# Patient Record
Sex: Female | Born: 1955 | Race: Black or African American | Hispanic: No | Marital: Married | Smoking: Never smoker
Health system: Southern US, Community
[De-identification: ages and names within clinical notes are randomized; demographics above are authoritative.]

## PROBLEM LIST (undated history)

## (undated) DIAGNOSIS — E119 Type 2 diabetes mellitus without complications: Secondary | ICD-10-CM

## (undated) DIAGNOSIS — I10 Essential (primary) hypertension: Secondary | ICD-10-CM

## (undated) HISTORY — PX: ABDOMINAL HYSTERECTOMY: SHX81

## (undated) HISTORY — PX: APPENDECTOMY: SHX54

## (undated) HISTORY — PX: TUBAL LIGATION: SHX77

---

## 1999-08-10 ENCOUNTER — Other Ambulatory Visit: Admission: RE | Admit: 1999-08-10 | Discharge: 1999-08-10 | Payer: Self-pay | Admitting: Obstetrics and Gynecology

## 1999-10-08 ENCOUNTER — Inpatient Hospital Stay (HOSPITAL_COMMUNITY): Admission: RE | Admit: 1999-10-08 | Discharge: 1999-10-11 | Payer: Self-pay | Admitting: Obstetrics and Gynecology

## 2000-10-29 ENCOUNTER — Other Ambulatory Visit: Admission: RE | Admit: 2000-10-29 | Discharge: 2000-10-29 | Payer: Self-pay | Admitting: Obstetrics and Gynecology

## 2002-01-27 ENCOUNTER — Other Ambulatory Visit: Admission: RE | Admit: 2002-01-27 | Discharge: 2002-01-27 | Payer: Self-pay | Admitting: Obstetrics and Gynecology

## 2003-06-10 ENCOUNTER — Ambulatory Visit (HOSPITAL_COMMUNITY): Admission: RE | Admit: 2003-06-10 | Discharge: 2003-06-10 | Payer: Self-pay | Admitting: *Deleted

## 2003-08-24 ENCOUNTER — Other Ambulatory Visit: Admission: RE | Admit: 2003-08-24 | Discharge: 2003-08-24 | Payer: Self-pay | Admitting: Obstetrics and Gynecology

## 2004-11-26 ENCOUNTER — Inpatient Hospital Stay (HOSPITAL_COMMUNITY): Admission: EM | Admit: 2004-11-26 | Discharge: 2004-11-27 | Payer: Self-pay | Admitting: Emergency Medicine

## 2014-11-14 ENCOUNTER — Encounter (HOSPITAL_COMMUNITY): Payer: Self-pay | Admitting: Emergency Medicine

## 2014-11-14 ENCOUNTER — Emergency Department (HOSPITAL_COMMUNITY)
Admission: EM | Admit: 2014-11-14 | Discharge: 2014-11-14 | Disposition: A | Discharge status: Home / Self Care | Payer: BC Managed Care – PPO | Attending: Emergency Medicine | Admitting: Emergency Medicine

## 2014-11-14 DIAGNOSIS — R110 Nausea: Secondary | ICD-10-CM | POA: Diagnosis not present

## 2014-11-14 DIAGNOSIS — R5383 Other fatigue: Secondary | ICD-10-CM | POA: Diagnosis present

## 2014-11-14 DIAGNOSIS — I10 Essential (primary) hypertension: Secondary | ICD-10-CM | POA: Diagnosis not present

## 2014-11-14 DIAGNOSIS — R531 Weakness: Secondary | ICD-10-CM | POA: Diagnosis not present

## 2014-11-14 HISTORY — DX: Essential (primary) hypertension: I10

## 2014-11-14 LAB — I-STAT CHEM 8, ED
BUN: 16 mg/dL (ref 6–23)
CALCIUM ION: 1.04 mmol/L — AB (ref 1.13–1.30)
CREATININE: 1.1 mg/dL (ref 0.50–1.10)
Chloride: 97 mmol/L (ref 96–112)
GLUCOSE: 116 mg/dL — AB (ref 70–99)
HCT: 50 % — ABNORMAL HIGH (ref 36.0–46.0)
HEMOGLOBIN: 17 g/dL — AB (ref 12.0–15.0)
POTASSIUM: 3.3 mmol/L — AB (ref 3.5–5.1)
Sodium: 137 mmol/L (ref 135–145)
TCO2: 24 mmol/L (ref 0–100)

## 2014-11-14 LAB — I-STAT CG4 LACTIC ACID, ED: Lactic Acid, Venous: 2.19 mmol/L (ref 0.5–2.0)

## 2014-11-14 MED ORDER — POTASSIUM CHLORIDE CRYS ER 20 MEQ PO TBCR
40.0000 meq | EXTENDED_RELEASE_TABLET | Freq: Once | ORAL | Status: AC
  Administered 2014-11-14: 40 meq via ORAL
  Filled 2014-11-14: qty 2

## 2014-11-14 NOTE — ED Provider Notes (Addendum)
CSN: [id number]     Arrival date & time 11/14/14  1002 History   First MD Initiated Contact with Patient 11/14/14 1029     Chief Complaint  Patient presents with  . Fatigue     (Consider location/radiation/quality/duration/timing/severity/associated sxs/prior Treatment) HPI Patient developed generalized weakness and nausea onset 8:10 AM today. Symptoms lasted 5 minutes, resolve spontaneously. No chest pain no shortness of breath no abdominal pain no headache. She is presently asymptomatic without treatment. She was sent here by coworkers. No other associated symptoms. Patient reports last week she felt that she had "the flu" with headache and general malaise lasting probably 36 hours. Those symptoms resolved spontaneously without treatment. Past Medical History  Diagnosis Date  . Hypertension    History reviewed. No pertinent past surgical history. No family history on file. History  Substance Use Topics  . Smoking status: Never Smoker   . Smokeless tobacco: Not on file  . Alcohol Use: No   OB History    No data available     Review of Systems  HENT: Negative.   Respiratory: Negative.   Cardiovascular: Negative.   Gastrointestinal: Positive for nausea.  Musculoskeletal: Negative.   Skin: Negative.   Neurological: Positive for weakness.  Psychiatric/Behavioral: Negative.   All other systems reviewed and are negative.     Allergies  Review of patient's allergies indicates not on file.  Home Medications   Prior to Admission medications   Not on File   BP 146/72 mmHg  Pulse 67  Temp(Src) 97.8 F (36.6 C) (Oral)  Resp 18  SpO2 100% Physical Exam  Constitutional: She is oriented to person, place, and time. She appears well-developed and well-nourished.  HENT:  Head: Normocephalic and atraumatic.  Eyes: Conjunctivae are normal. Pupils are equal, round, and reactive to light.  Neck: Neck supple. No tracheal deviation present. No thyromegaly present.   Cardiovascular: Normal rate and regular rhythm.   No murmur heard. Pulmonary/Chest: Effort normal and breath sounds normal.  Abdominal: Soft. Bowel sounds are normal. She exhibits no distension. There is no tenderness.  Musculoskeletal: Normal range of motion. She exhibits no edema or tenderness.  Neurological: She is alert and oriented to person, place, and time. No cranial nerve deficit. Coordination normal.  Gait normal not lightheaded on standing  Skin: Skin is warm and dry. No rash noted.  Psychiatric: She has a normal mood and affect.  Nursing note and vitals reviewed.   ED Course  Procedures (including critical care time) Labs Review Labs Reviewed - No data to display  Imaging Review No results found.   EKG Interpretation   Date/Time:  Monday November 14 2014 10:52:36 EST Ventricular Rate:  65 PR Interval:  142 QRS Duration: 109 QT Interval:  423 QTC Calculation: 440 R Axis:   -9 Text Interpretation:  Sinus rhythm RSR' in V1 or V2, probably normal  variant No significant change since last tracing Confirmed by Jaxten Brosh   MD, Cabria Micalizzi [phone]) on 11/14/2014 11:04:23 AM     12:15 PM patient resting comfortably. Asymptomatic Results for orders placed or performed during the hospital encounter of 11/14/14  I-stat chem 8, ed  Result Value Ref Range   Sodium 137 135 - 145 mmol/L   Potassium 3.3 (L) 3.5 - 5.1 mmol/L   Chloride 97 96 - 112 mmol/L   BUN 16 6 - 23 mg/dL   Creatinine, Ser 1.91 0.50 - 1.10 mg/dL   Glucose, Bld 478 (H) 70 - 99 mg/dL   Calcium, Ion 2.95 (  L) 1.13 - 1.30 mmol/L   TCO2 24 0 - 100 mmol/L   Hemoglobin 17.0 (H) 12.0 - 15.0 g/dL   HCT 16.150.0 (H) 09.636.0 - 04.546.0 %  I-Stat CG4 Lactic Acid, ED  Result Value Ref Range   Lactic Acid, Venous 2.19 (HH) 0.5 - 2.0 mmol/L   No results found.  MDM   Not lactate was not ordered by me and was drawn in error and does not correlate clinically to acute illness Doubt angina equivalen Patient's symptoms very brief self  limiting. No chest pain. No EKG abnormality.  Plan potassium chloride 40 meq prior to discharge. Follow-up Dr. Zachery DauerBarnes next week Final diagnoses:  None   diagnosis #1 transient weakness #2 hypokalemia      Doug SouSam Pete Schnitzer, MD 11/14/14 1230  Doug SouSam Kindell Strada, MD 11/14/14 1230

## 2014-11-14 NOTE — Discharge Instructions (Signed)
Weakness Blood potassium was slightly low today at 3.3. You're given additional potassium here .Call Dr. Zachery DauerBarnes to schedule an office visit for within a week. Tell office staff that you were seen here in the your blood potassium level should be rechecked. Return if you feel worse for any reason Weakness is a lack of strength. You may feel weak all over your body or just in one part of your body. Weakness can be serious. In some cases, you may need more medical tests. HOME CARE  Rest.  Eat a well-balanced diet.  Try to exercise every day.  Only take medicines as told by your doctor. GET HELP RIGHT AWAY IF:   You cannot do your normal daily activities.  You cannot walk up and down stairs, or you feel very tired when you do so.  You have shortness of breath or chest pain.  You have trouble moving parts of your body.  You have weakness in only one body part or on only one side of the body.  You have a fever.  You have trouble speaking or swallowing.  You cannot control when you pee (urinate) or poop (bowel movement).  You have black or bloody throw up (vomit) or poop.  Your weakness gets worse or spreads to other body parts.  You have new aches or pains. MAKE SURE YOU:   Understand these instructions.  Will watch your condition.  Will get help right away if you are not doing well or get worse. Document Released: 08/08/2008 Document Revised: 02/25/2012 Document Reviewed: 10/25/2011 [facility] Patient Information 2015 [place], [place]. This information is not intended to replace advice given to you by your health care provider. Make sure you discuss any questions you have with your health care provider.

## 2014-11-14 NOTE — ED Notes (Signed)
Per pt, states she is getting over flu-started last week, went to work today and felt weak and fatigued

## 2014-11-14 NOTE — ED Notes (Signed)
Dr Geoffery SpruceJabcubowitz made aware of abnormal lactic.

## 2019-06-21 ENCOUNTER — Other Ambulatory Visit: Payer: Self-pay

## 2019-06-21 DIAGNOSIS — Z20822 Contact with and (suspected) exposure to covid-19: Secondary | ICD-10-CM

## 2019-06-22 LAB — NOVEL CORONAVIRUS, NAA: SARS-CoV-2, NAA: NOT DETECTED

## 2019-06-23 ENCOUNTER — Telehealth: Payer: Self-pay | Admitting: General Practice

## 2019-06-23 NOTE — Telephone Encounter (Signed)
Negative COVID results given. Patient results "NOT Detected." Caller expressed understanding. ° °

## 2020-09-19 DIAGNOSIS — R7303 Prediabetes: Secondary | ICD-10-CM | POA: Diagnosis not present

## 2020-09-19 DIAGNOSIS — H5789 Other specified disorders of eye and adnexa: Secondary | ICD-10-CM | POA: Diagnosis not present

## 2020-09-19 DIAGNOSIS — I10 Essential (primary) hypertension: Secondary | ICD-10-CM | POA: Diagnosis not present

## 2020-09-22 DIAGNOSIS — R7303 Prediabetes: Secondary | ICD-10-CM | POA: Diagnosis not present

## 2020-09-22 DIAGNOSIS — I10 Essential (primary) hypertension: Secondary | ICD-10-CM | POA: Diagnosis not present

## 2020-09-22 DIAGNOSIS — H5789 Other specified disorders of eye and adnexa: Secondary | ICD-10-CM | POA: Diagnosis not present

## 2020-11-21 DIAGNOSIS — Z6841 Body Mass Index (BMI) 40.0 and over, adult: Secondary | ICD-10-CM | POA: Diagnosis not present

## 2020-11-21 DIAGNOSIS — Z01419 Encounter for gynecological examination (general) (routine) without abnormal findings: Secondary | ICD-10-CM | POA: Diagnosis not present

## 2020-11-21 DIAGNOSIS — Z1231 Encounter for screening mammogram for malignant neoplasm of breast: Secondary | ICD-10-CM | POA: Diagnosis not present

## 2020-11-28 DIAGNOSIS — N958 Other specified menopausal and perimenopausal disorders: Secondary | ICD-10-CM | POA: Diagnosis not present

## 2020-12-12 DIAGNOSIS — M79674 Pain in right toe(s): Secondary | ICD-10-CM | POA: Diagnosis not present

## 2021-01-15 DIAGNOSIS — I10 Essential (primary) hypertension: Secondary | ICD-10-CM | POA: Diagnosis not present

## 2021-01-15 DIAGNOSIS — R7303 Prediabetes: Secondary | ICD-10-CM | POA: Diagnosis not present

## 2021-01-15 DIAGNOSIS — M79671 Pain in right foot: Secondary | ICD-10-CM | POA: Diagnosis not present

## 2021-01-15 DIAGNOSIS — N1831 Chronic kidney disease, stage 3a: Secondary | ICD-10-CM | POA: Diagnosis not present

## 2021-02-14 DIAGNOSIS — Z800 Family history of malignant neoplasm of digestive organs: Secondary | ICD-10-CM | POA: Diagnosis not present

## 2021-02-14 DIAGNOSIS — Z1211 Encounter for screening for malignant neoplasm of colon: Secondary | ICD-10-CM | POA: Diagnosis not present

## 2021-02-14 DIAGNOSIS — Z6841 Body Mass Index (BMI) 40.0 and over, adult: Secondary | ICD-10-CM | POA: Diagnosis not present

## 2021-02-15 ENCOUNTER — Other Ambulatory Visit: Payer: Self-pay | Admitting: Gastroenterology

## 2021-02-15 DIAGNOSIS — Z800 Family history of malignant neoplasm of digestive organs: Secondary | ICD-10-CM

## 2021-02-19 DIAGNOSIS — I10 Essential (primary) hypertension: Secondary | ICD-10-CM | POA: Diagnosis not present

## 2021-03-07 ENCOUNTER — Other Ambulatory Visit: Payer: Self-pay

## 2021-03-07 ENCOUNTER — Ambulatory Visit
Admission: RE | Admit: 2021-03-07 | Discharge: 2021-03-07 | Disposition: A | Discharge status: Home / Self Care | Payer: Medicare PPO | Source: Ambulatory Visit | Attending: Gastroenterology | Admitting: Gastroenterology

## 2021-03-07 DIAGNOSIS — Z800 Family history of malignant neoplasm of digestive organs: Secondary | ICD-10-CM

## 2021-03-07 DIAGNOSIS — Z8507 Personal history of malignant neoplasm of pancreas: Secondary | ICD-10-CM | POA: Diagnosis not present

## 2021-03-07 DIAGNOSIS — K449 Diaphragmatic hernia without obstruction or gangrene: Secondary | ICD-10-CM | POA: Diagnosis not present

## 2021-03-07 MED ORDER — GADOBENATE DIMEGLUMINE 529 MG/ML IV SOLN
20.0000 mL | Freq: Once | INTRAVENOUS | Status: AC | PRN
  Administered 2021-03-07: 20 mL via INTRAVENOUS

## 2021-06-11 DIAGNOSIS — Z23 Encounter for immunization: Secondary | ICD-10-CM | POA: Diagnosis not present

## 2021-06-11 DIAGNOSIS — H109 Unspecified conjunctivitis: Secondary | ICD-10-CM | POA: Diagnosis not present

## 2021-06-19 DIAGNOSIS — N1831 Chronic kidney disease, stage 3a: Secondary | ICD-10-CM | POA: Diagnosis not present

## 2021-06-19 DIAGNOSIS — H53143 Visual discomfort, bilateral: Secondary | ICD-10-CM | POA: Diagnosis not present

## 2021-06-19 DIAGNOSIS — H5713 Ocular pain, bilateral: Secondary | ICD-10-CM | POA: Diagnosis not present

## 2021-06-19 DIAGNOSIS — R519 Headache, unspecified: Secondary | ICD-10-CM | POA: Diagnosis not present

## 2021-06-19 DIAGNOSIS — I10 Essential (primary) hypertension: Secondary | ICD-10-CM | POA: Diagnosis not present

## 2021-06-20 DIAGNOSIS — H20043 Secondary noninfectious iridocyclitis, bilateral: Secondary | ICD-10-CM | POA: Diagnosis not present

## 2021-07-04 DIAGNOSIS — H40053 Ocular hypertension, bilateral: Secondary | ICD-10-CM | POA: Diagnosis not present

## 2021-07-04 DIAGNOSIS — H20043 Secondary noninfectious iridocyclitis, bilateral: Secondary | ICD-10-CM | POA: Diagnosis not present

## 2021-07-16 DIAGNOSIS — H40053 Ocular hypertension, bilateral: Secondary | ICD-10-CM | POA: Diagnosis not present

## 2021-07-16 DIAGNOSIS — H20043 Secondary noninfectious iridocyclitis, bilateral: Secondary | ICD-10-CM | POA: Diagnosis not present

## 2021-07-27 DIAGNOSIS — H40053 Ocular hypertension, bilateral: Secondary | ICD-10-CM | POA: Diagnosis not present

## 2021-07-27 DIAGNOSIS — H2513 Age-related nuclear cataract, bilateral: Secondary | ICD-10-CM | POA: Diagnosis not present

## 2021-07-27 DIAGNOSIS — H209 Unspecified iridocyclitis: Secondary | ICD-10-CM | POA: Diagnosis not present

## 2021-09-10 DIAGNOSIS — H20043 Secondary noninfectious iridocyclitis, bilateral: Secondary | ICD-10-CM | POA: Diagnosis not present

## 2021-09-10 DIAGNOSIS — H40053 Ocular hypertension, bilateral: Secondary | ICD-10-CM | POA: Diagnosis not present

## 2021-09-14 DIAGNOSIS — E7800 Pure hypercholesterolemia, unspecified: Secondary | ICD-10-CM | POA: Diagnosis not present

## 2021-09-14 DIAGNOSIS — N1831 Chronic kidney disease, stage 3a: Secondary | ICD-10-CM | POA: Diagnosis not present

## 2021-09-14 DIAGNOSIS — I10 Essential (primary) hypertension: Secondary | ICD-10-CM | POA: Diagnosis not present

## 2021-09-14 DIAGNOSIS — E1169 Type 2 diabetes mellitus with other specified complication: Secondary | ICD-10-CM | POA: Diagnosis not present

## 2021-09-14 DIAGNOSIS — R7303 Prediabetes: Secondary | ICD-10-CM | POA: Diagnosis not present

## 2021-09-14 DIAGNOSIS — Z0000 Encounter for general adult medical examination without abnormal findings: Secondary | ICD-10-CM | POA: Diagnosis not present

## 2021-10-08 DIAGNOSIS — R7303 Prediabetes: Secondary | ICD-10-CM | POA: Insufficient documentation

## 2021-10-08 DIAGNOSIS — Z6841 Body Mass Index (BMI) 40.0 and over, adult: Secondary | ICD-10-CM | POA: Insufficient documentation

## 2021-10-08 DIAGNOSIS — E876 Hypokalemia: Secondary | ICD-10-CM | POA: Insufficient documentation

## 2021-10-08 DIAGNOSIS — Z03818 Encounter for observation for suspected exposure to other biological agents ruled out: Secondary | ICD-10-CM | POA: Diagnosis not present

## 2021-10-08 DIAGNOSIS — E7800 Pure hypercholesterolemia, unspecified: Secondary | ICD-10-CM | POA: Insufficient documentation

## 2021-10-08 DIAGNOSIS — E1169 Type 2 diabetes mellitus with other specified complication: Secondary | ICD-10-CM | POA: Insufficient documentation

## 2021-10-08 DIAGNOSIS — I10 Essential (primary) hypertension: Secondary | ICD-10-CM | POA: Diagnosis not present

## 2021-10-08 DIAGNOSIS — N1831 Chronic kidney disease, stage 3a: Secondary | ICD-10-CM | POA: Insufficient documentation

## 2021-10-08 DIAGNOSIS — Z20822 Contact with and (suspected) exposure to covid-19: Secondary | ICD-10-CM | POA: Diagnosis not present

## 2021-11-29 DIAGNOSIS — Z1231 Encounter for screening mammogram for malignant neoplasm of breast: Secondary | ICD-10-CM | POA: Diagnosis not present

## 2021-11-29 DIAGNOSIS — Z01419 Encounter for gynecological examination (general) (routine) without abnormal findings: Secondary | ICD-10-CM | POA: Diagnosis not present

## 2021-11-29 DIAGNOSIS — Z6841 Body Mass Index (BMI) 40.0 and over, adult: Secondary | ICD-10-CM | POA: Diagnosis not present

## 2022-02-27 IMAGING — MR MR ABDOMEN WO/W CM
13 of 20 series · 29 of 48 positions shown · IV contrast (multihance)
Comparison: None.

CLINICAL DATA: Family history of pancreatic cancer

EXAM:
MRI ABDOMEN WITHOUT AND WITH CONTRAST
TECHNIQUE: Multiplanar multisequence MR imaging of the abdomen was performed
both before and after the administration of intravenous contrast.
CONTRAST:  20mL MULTIHANCE GADOBENATE DIMEGLUMINE 529 MG/ML IV SOLN

[Series 3: T2 · coronal · 5.0mm · 1.56mm/px · 2 of 40 slices shown (1 of 3)]
[im 1/40]
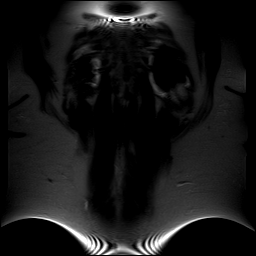
[im 40/40]
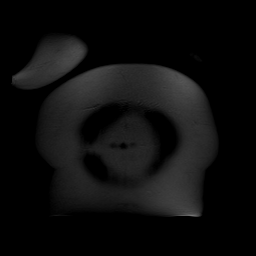

[Series 4: T2 · axial · 5.0mm · 1.41mm/px · z∈[-109,+135]mm · 2 of 40 slices shown (2 of 3)]
[im 1/40]
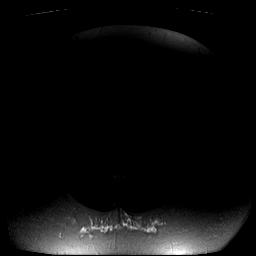
[im 40/40]
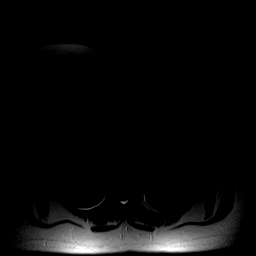

[Series 5: axial in out · axial · 6.0mm · 0.74mm/px · z∈[-110,+136]mm · 4 of 72 slices shown]
[im 1/72]
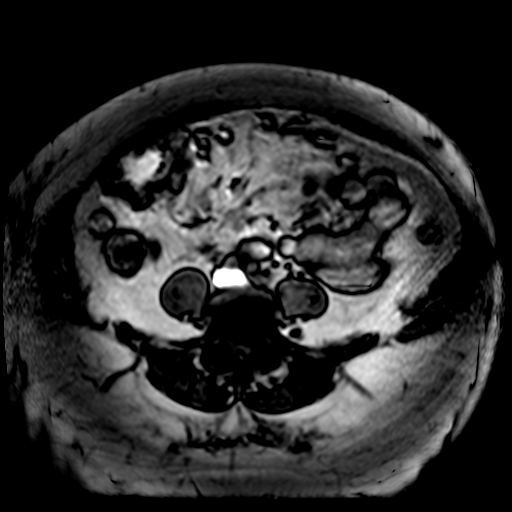
[im 24/72]
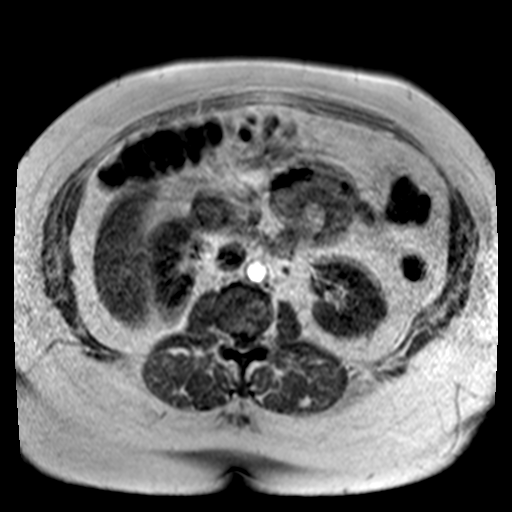
[im 48/72]
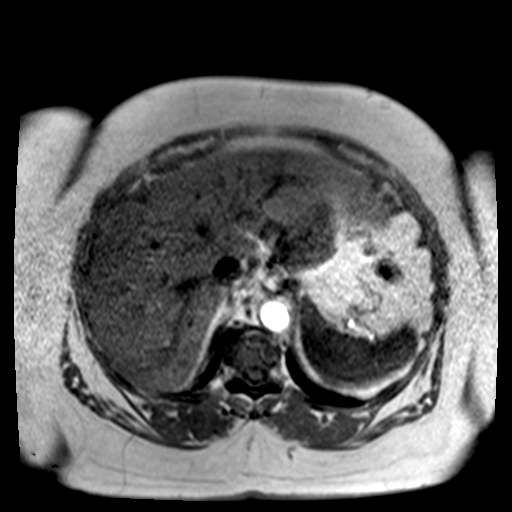
[im 72/72]
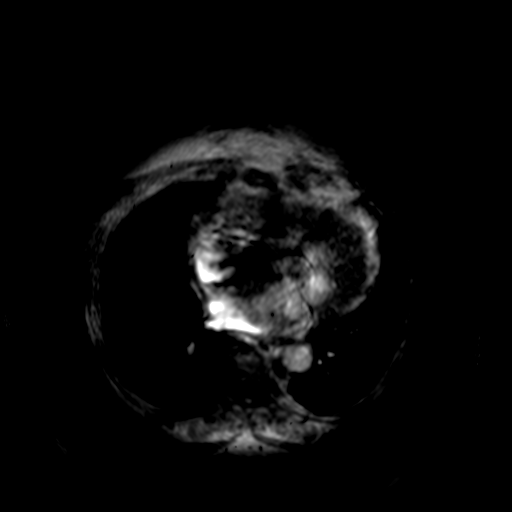

[Series 6: T2 · axial · 6.0mm · 0.74mm/px · 1 of 40 slices shown (3 of 3)]
[im 1/40]
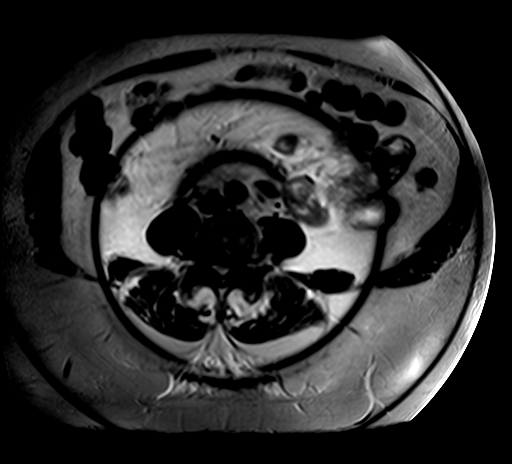

[Series 9: MRCP · sagittal · 0.94mm/px · 1 of 9 slices shown]
[im 1/9]
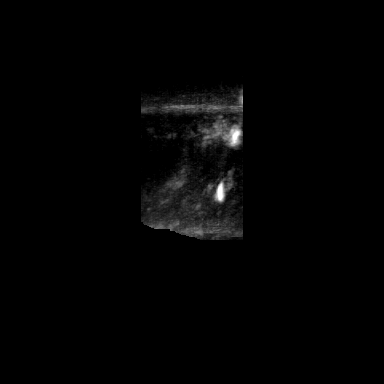

[Series 11: ep2d_diff_b50_500_800_p2_trig · axial · 6.0mm · 1.98mm/px · z∈[-95,+186]mm · 4 of 120 slices shown]
[im 1/120]
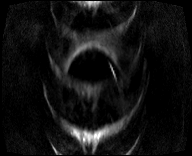
[im 40/120]
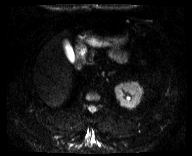
[im 80/120]
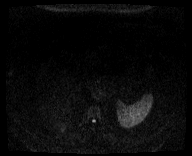
[im 120/120]
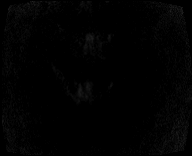

[Series 12: ep2d_diff_b50_500_800_p2_trig_adc · axial · 6.0mm · 1.98mm/px · 1 of 40 slices shown]
[im 1/40]
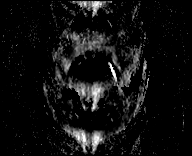

[Series 13: axial tru fisp · axial · 5.0mm · 1.41mm/px · 1 of 37 slices shown (1 of 2)]
[im 1/37]
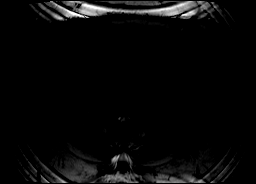

[Series 14: T1 dynamic · axial · non-contrast · 3.0mm · 0.78mm/px · z∈[-124,+137]mm · 3 of 88 slices shown]
[im 1/88]
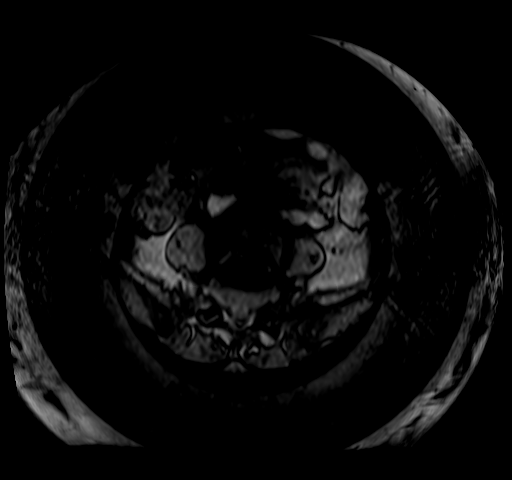
[im 44/88]
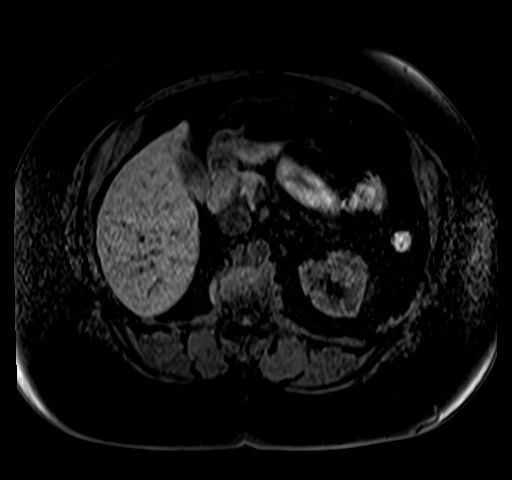
[im 88/88]
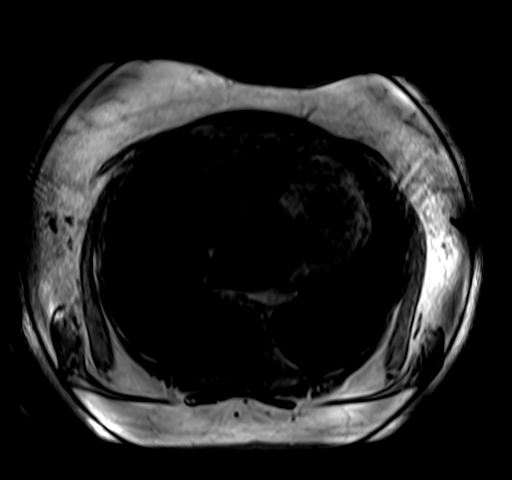

[Series 15: axial tru fisp · axial · 5.0mm · 1.41mm/px · 1 of 37 slices shown (2 of 2)]
[im 1/37]
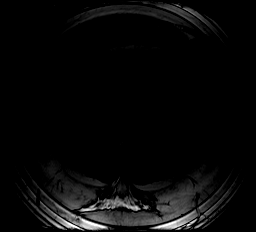

[Series 16: post 25 sec · axial · 3.0mm · 0.78mm/px · z∈[-124,+137]mm · 3 of 88 slices shown]
[im 1/88]
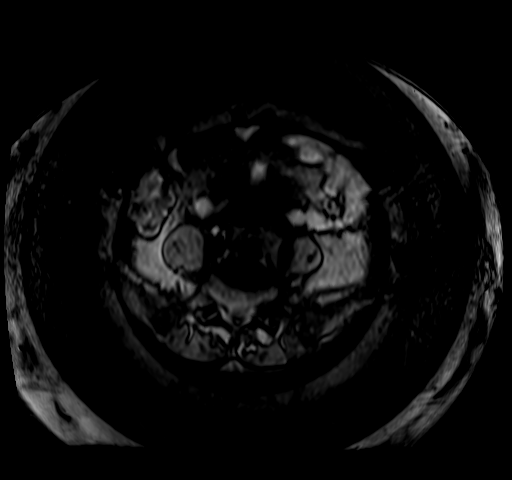
[im 44/88]
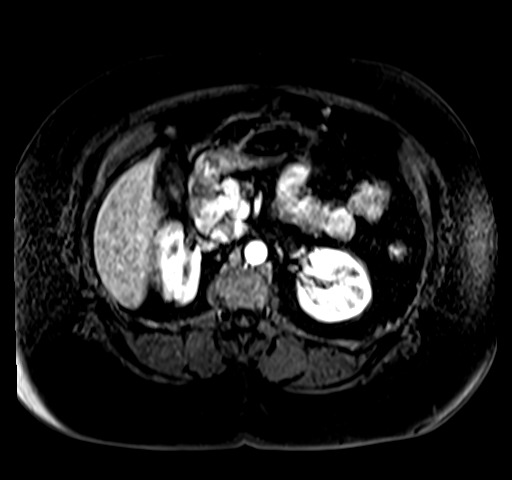
[im 88/88]
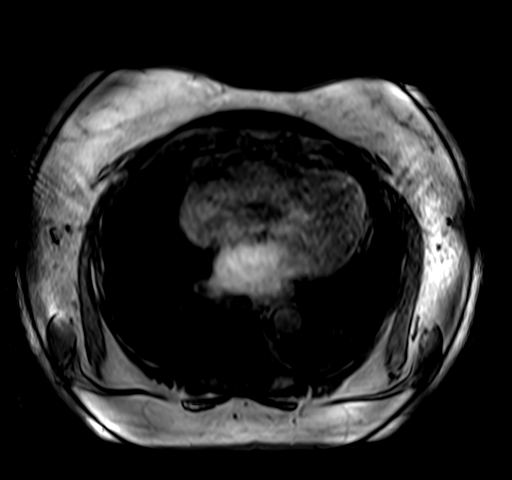

[Series 17: post 25 sec_sub · axial · 3.0mm · 0.78mm/px · z∈[-124,+137]mm · 3 of 88 slices shown]
[im 1/88]
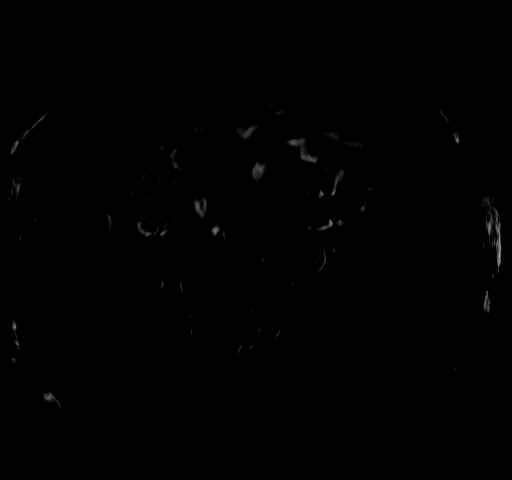
[im 44/88]
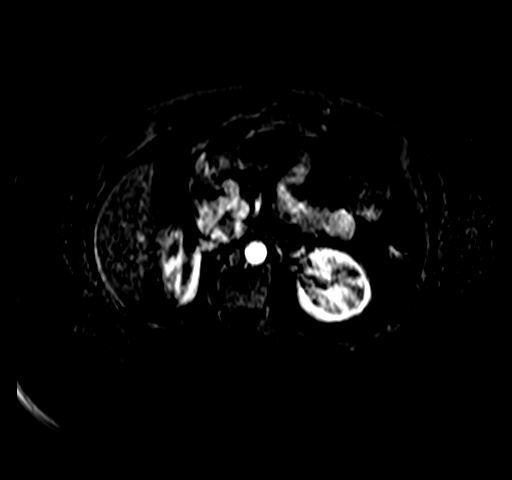
[im 88/88]
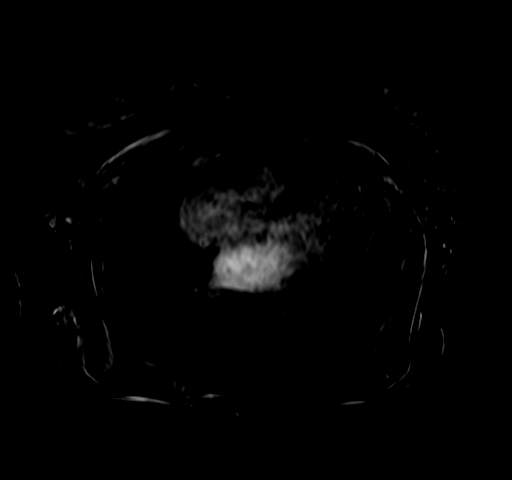

[Series 18: post 45 sec · axial · 3.0mm · 0.78mm/px · z∈[-124,+137]mm · 3 of 88 slices shown]
[im 1/88]
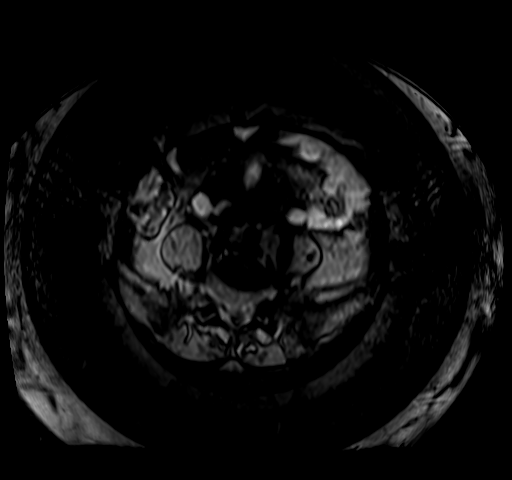
[im 44/88]
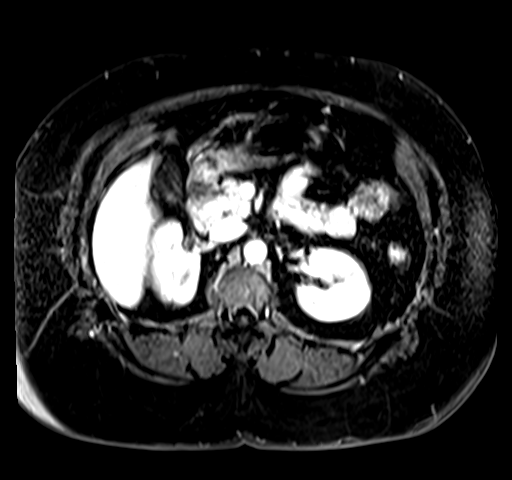
[im 88/88]
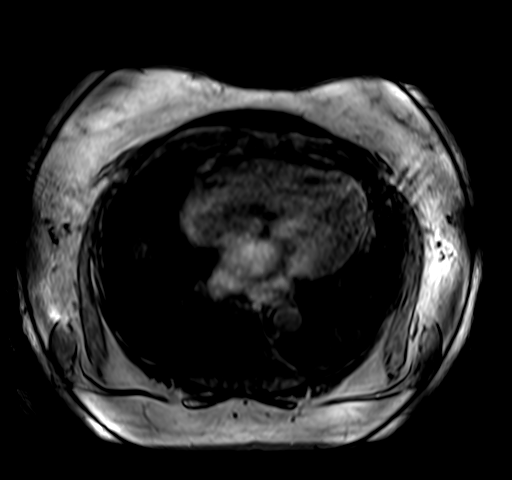

[29 of 48 positions shown; findings below may reference images not displayed]

FINDINGS: Lower chest: No acute findings. Moderate hiatal hernia with
intrathoracic position of the gastric fundus.

Hepatobiliary: No mass or other parenchymal abnormality identified.
No biliary ductal dilatation.

Pancreas: No mass, inflammatory changes, or other parenchymal
abnormality identified. No pancreatic ductal dilatation.

Spleen:  Within normal limits in size and appearance.

Adrenals/Urinary Tract: No masses identified. No evidence of
hydronephrosis.

Stomach/Bowel: Visualized portions within the abdomen are
unremarkable.

Vascular/Lymphatic: No pathologically enlarged lymph nodes
identified. No abdominal aortic aneurysm demonstrated.

Other:  None.

Musculoskeletal: No suspicious bone lesions identified.
IMPRESSION: 1. No evidence of pancreatic mass or other evidence of malignancy in
the abdomen.
2. Moderate hiatal hernia.

## 2022-03-18 DIAGNOSIS — H40053 Ocular hypertension, bilateral: Secondary | ICD-10-CM | POA: Diagnosis not present

## 2022-03-18 DIAGNOSIS — H20043 Secondary noninfectious iridocyclitis, bilateral: Secondary | ICD-10-CM | POA: Diagnosis not present

## 2022-03-18 DIAGNOSIS — H25813 Combined forms of age-related cataract, bilateral: Secondary | ICD-10-CM | POA: Diagnosis not present

## 2022-03-18 DIAGNOSIS — H35033 Hypertensive retinopathy, bilateral: Secondary | ICD-10-CM | POA: Diagnosis not present

## 2022-03-28 DIAGNOSIS — E7800 Pure hypercholesterolemia, unspecified: Secondary | ICD-10-CM | POA: Diagnosis not present

## 2022-03-28 DIAGNOSIS — E1169 Type 2 diabetes mellitus with other specified complication: Secondary | ICD-10-CM | POA: Diagnosis not present

## 2022-03-28 DIAGNOSIS — H35033 Hypertensive retinopathy, bilateral: Secondary | ICD-10-CM | POA: Diagnosis not present

## 2022-03-28 DIAGNOSIS — I10 Essential (primary) hypertension: Secondary | ICD-10-CM | POA: Diagnosis not present

## 2022-03-28 DIAGNOSIS — N1831 Chronic kidney disease, stage 3a: Secondary | ICD-10-CM | POA: Diagnosis not present

## 2022-03-28 DIAGNOSIS — Z6841 Body Mass Index (BMI) 40.0 and over, adult: Secondary | ICD-10-CM | POA: Diagnosis not present

## 2022-04-25 DIAGNOSIS — M272 Inflammatory conditions of jaws: Secondary | ICD-10-CM | POA: Diagnosis not present

## 2022-06-24 DIAGNOSIS — N1831 Chronic kidney disease, stage 3a: Secondary | ICD-10-CM | POA: Diagnosis not present

## 2022-06-24 DIAGNOSIS — M545 Low back pain, unspecified: Secondary | ICD-10-CM | POA: Diagnosis not present

## 2022-06-24 DIAGNOSIS — E1169 Type 2 diabetes mellitus with other specified complication: Secondary | ICD-10-CM | POA: Diagnosis not present

## 2022-07-15 DIAGNOSIS — M25551 Pain in right hip: Secondary | ICD-10-CM | POA: Diagnosis not present

## 2022-07-15 DIAGNOSIS — M545 Low back pain, unspecified: Secondary | ICD-10-CM | POA: Diagnosis not present

## 2022-07-15 DIAGNOSIS — Z6841 Body Mass Index (BMI) 40.0 and over, adult: Secondary | ICD-10-CM | POA: Diagnosis not present

## 2022-07-25 NOTE — Therapy (Signed)
OUTPATIENT PHYSICAL THERAPY THORACOLUMBAR EVALUATION   Patient Name: Kristen Alvarado MRN: [id number] DOB:Aug 11, 1956, 66 y.o., female Today's Date: 07/30/2022  END OF SESSION:  PT End of Session - 07/30/22 0949     Visit Number 1    Number of Visits 13    Date for PT Re-Evaluation 09/10/22    Authorization Type E-HUMANA MEDICARE/HUMANA MEDICARE CHOICE PPO Foto 6th and 10th visit progress note 10th    Progress Note Due on Visit 10    PT Start Time 0848    PT Stop Time 0930    PT Time Calculation (min) 42 min    Activity Tolerance Patient limited by pain;Patient tolerated treatment well    Behavior During Therapy [facility] for tasks assessed/performed             Past Medical History:  Diagnosis Date   Hypertension    No past surgical history on file. There are no problems to display for this patient.   PCP: Soundra Pilon, FNP   REFERRING PROVIDER: Daisy Floro, MD   REFERRING DIAG: M54.50 (ICD-10-CM) - Low back pain, unspecified   Rationale for Evaluation and Treatment: Rehabilitation  THERAPY DIAG:  Chronic bilateral low back pain with bilateral sciatica - Plan: PT plan of care cert/re-cert  Muscle weakness (generalized) - Plan: PT plan of care cert/re-cert  Difficulty in walking, not elsewhere classified - Plan: PT plan of care cert/re-cert  ONSET DATE: October 2023  SUBJECTIVE:                                                                                                                                                                                           SUBJECTIVE STATEMENT: I tripped  but I strained my back and it radiates down my legs when I stand too long. I have not fallen at all  in last 6 months but I did trip over a box in the floor. I go to [employer] but when I tripped in mid October I have not been back because I hurt so much  I am a 10/10.  I used to walk and do some weights.  I can only walk 2 min.  I can't stand longer than 1 min.   I can sit and watch TV 30 minutes. My pain is straight across my low back and into my buttocks and sometimes down to both calf muscles.  PERTINENT HISTORY:  HTN, DM, hypercholesteremia,obesity  PAIN:  Are you having pain? Yes: NPRS scale: 10/10 even sitting and resting and my pain is over 10/10 Pain location: BIl low back and radiates into my buttocks and thigh  but not past my knees sometimes when I stand too long I have pain down both my calf muscles Pain description: muscle cramps, throbbing and sharp Aggravating factors: walking and standing, I can't wash dishes, cook, or laundry getting up from chair is better than sitting down. I just fall into my chair Relieving factors: nothing,sometimes Tylenol but I still have pain  PRECAUTIONS: None  WEIGHT BEARING RESTRICTIONS: No  FALLS:  Has patient fallen in last 6 months? 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  When I get up and down from chair without falling into my chair. And going back to the [employer] to work out  NEXT MD VISIT:   OBJECTIVE:   DIAGNOSTIC FINDINGS:  None recent  PATIENT SURVEYS:  FOTO 40% predicted 59%  SCREENING FOR RED FLAGS: Bowel or bladder incontinence: No   COGNITION: Overall cognitive status: Within functional limits for tasks assessed     SENSATION: Tingling numbness into bil buttocks and pain into calf mx bil  MUSCLE LENGTH: Hamstrings: Right 60 deg; Left 60 deg   POSTURE: rounded shoulders, forward head, anterior pelvic tilt, and obesity Pt with flexed posture standing and sitting  PALPATION: Bil QL tenderness and bil buttocks over iliac crests  LUMBAR ROM:   AROM eval  Flexion Able to touch shins bil  Extension 0  Right lateral flexion 1 inch above knee jt  line  Left lateral flexion Knee jt line  Right rotation 50%  Left rotation 50%   (Blank rows = not tested)  LOWER EXTREMITY ROM:     Active  Right eval Left eval  Hip flexion 4 4  Hip extension 3+pain 3+ pain  Hip abduction 3- 3-  Hip adduction    Hip internal rotation 25 20  Hip external rotation    Knee flexion 4 4  Knee extension 4 4  Ankle dorsiflexion    Ankle plantarflexion    Ankle inversion    Ankle eversion     (Blank rows = not tested)  LOWER EXTREMITY MMT:    MMT Right eval Left eval  Hip flexion 70 standing 70 standing  Hip extension    Hip abduction    Hip adduction    Hip internal rotation 25 20  Hip external rotation 35 45  Knee flexion    Knee extension 4   Ankle dorsiflexion    Ankle plantarflexion 20/25 15/25  Ankle inversion    Ankle eversion     (Blank rows = not tested)  LUMBAR SPECIAL TESTS:  Straight leg raise test: Negative and Slump test: Negative  FUNCTIONAL TESTS:  5 times sit to stand: 20.56 sec 291 ft Normal 546 ft  GAIT: Distance walked: 175 feet Assistive device utilized: None Level of assistance: Complete Independence Comments: Pt cannot tolerate standing or walking less than 3 minutes  TODAY'S TREATMENT:  DATE: 07-30-22   issue HEP and EVAL    PATIENT EDUCATION:  Education details: POC explanation of findings  issue HEP Person educated: Patient Education method: Explanation, Demonstration, Tactile cues, Verbal cues, and Handouts Education comprehension: verbalized understanding, returned demonstration, verbal cues required, tactile cues required, and needs further education  HOME EXERCISE PROGRAM: Access Code: [email or web address] URL: [email or web address] Date: 07/30/2022 Prepared by: Garen Lah  Exercises - Supine Pelvic Tilt  - 1 x daily - 7 x weekly - 3 sets - 10 reps -  Supine Single Knee to Chest Stretch  - 2 x daily - 7 x weekly - 1 sets - 5 reps - 10 hold - Supine Lower Trunk Rotation  - 2 x daily - 7 x weekly - 1 sets - 5 reps - 20 hold - Supine Hamstring Stretch with Strap  - 2 x daily - 7 x weekly - 1 sets - 3 reps - 20-47msec hold - Supine Piriformis Stretch with Foot on Ground  - 1 x daily - 7 x weekly - 1 sets - 5 reps - 10 sec hold  ASSESSMENT:  CLINICAL IMPRESSION: Patient is a 66 y.o. female who was seen today for physical therapy evaluation and treatment for low back pain with radiating symptoms into bil buttocks and displaying overall decrease in flexibility. Pt reports tripping over a box mid October and she has not been able to return to [facility] for exercise walking and weight machines. Pt reported at 10/10.  Pt does report that she is not able transfer especially sitting down with lack of strength and cannot control descent.  Pt also is not able to transfer from floor to standing. Ms Madera lives alone and needs to increase strength/flexibility to live safely at home.     OBJECTIVE IMPAIRMENTS: decreased mobility, difficulty walking, decreased ROM, decreased strength, increased fascial restrictions, impaired flexibility, improper body mechanics, postural dysfunction, obesity, and pain.   ACTIVITY LIMITATIONS: bending, standing, squatting, stairs, and transfers  PARTICIPATION LIMITATIONS: meal prep, cleaning, and laundry  PERSONAL FACTORS: HTN, DM, hypercholesteremia,obesity are also affecting patient's functional outcome.   REHAB POTENTIAL: Good  CLINICAL DECISION MAKING: Evolving/moderate complexity  EVALUATION COMPLEXITY: Moderate   GOALS: Goals reviewed with patient? Yes  SHORT TERM GOALS: Target date: August 20, 2022   Independent with initial HEP Baseline:no knowledge Goal status: INITIAL  2.  Report pain decrease at rest from  10 /10 to  5 /10 Baseline: 10/10 Goal status: INITIAL  3.  Demonstrate understanding of  neutral posture and be more conscious of position and posture throughout the day.  Baseline: Pt with flexed posture and sitting on sacrum Goal status: INITIAL  4.  Pt will be able to control descent with sitting transfer Baseline: Pt unable to control descent with sitting due to pain 10/10 Goal status: INITIAL    LONG TERM GOALS: Target date: Sep 10, 2022  Pt will be independent with advanced HEP Baseline: no knowledge Goal status: INITIAL  2.  Demonstrate and verbalize techniques to reduce the risk of re-injury including: lifting, posture, body mechanics Baseline: no knowledge Goal status: INITIAL  3.  FOTO will improve from  40%  to  59%   indicating improved functional mobility.  Baseline: eval 40% Goal status: INITIAL  4.  Pt will reduce 5 x STS to 13 sec or less to show improved LE strength Baseline: 20.56 sec eval Goal status: INITIAL  5.  Pt will demonstrate safe floor to stand transfer to show improved flexibility and increase  safety living alone Baseline: not able to transfer from floor to stand Goal status: INITIAL  6.  Pt will be able to perform household chores with pain 3/10 or less Baseline: 10/10 Goal status: INITIAL  PLAN:  PT FREQUENCY: 2x/week  PT DURATION: 6 weeks  PLANNED INTERVENTIONS: Therapeutic exercises, Therapeutic activity, Neuromuscular re-education, Balance training, Gait training, Patient/Family education, Self Care, Joint mobilization, Stair training, Dry Needling, Electrical stimulation, Spinal mobilization, Cryotherapy, Moist heat, Taping, Traction, Ionotophoresis 4mg /ml Dexamethasone, Manual therapy, and Re-evaluation.  PLAN FOR NEXT SESSION: TPDN , HEP review, Manual    Referring diagnosis? M54.50 (ICD-10-CM) - Low back pain, unspecified Treatment diagnosis? (if different than referring diagnosis) Chronic bilateral low back pain with bilateral sciatica  Muscle weakness (generalized) What was this (referring dx) caused by? []   Surgery [x]  Fall []  Ongoing issue [x]  Arthritis [x]  Other: __tripping injury with back pain__________  Laterality: []  Rt []  Lt [x]  Both  Check all possible CPT codes:  *CHOOSE 10 OR LESS*    []  97110 (Therapeutic Exercise)  []  92507 (SLP Treatment)  []  97112 (Neuro Re-ed)   []  92526 (Swallowing Treatment)   []  97116 (Gait Training)   []  [id number] (Cognitive Training, 1st 15 minutes) []  97140 (Manual Therapy)   []  97130 (Cognitive Training, each add'l 15 minutes)  []  97164 (Re-evaluation)                              []  Other, List CPT Code ____________  []  [id number] (Therapeutic Activities)     []  97535 (Self Care)   [x]  All codes above (97110 - 97535)  [x]  97012 (Mechanical Traction)  [x]  97014 (E-stim Unattended)  [x]  97032 (E-stim manual)  []  97033 (Ionto)  [x]  97035 (Ultrasound) [x]  97750 (Physical Performance Training) []  [id number] (Aquatic Therapy) []  97016 (Vasopneumatic Device) []  [id number] (Paraffin) []  97034 (Contrast Bath) []  97597 (Wound Care 1st 20 sq cm) []  97598 (Wound Care each add'l 20 sq cm) []  97760 (Orthotic Fabrication, Fitting, Training Initial) []  [id number] (Prosthetic Management and Training Initial) []  [id number] (Orthotic or Prosthetic Training/ Modification Subsequent)

## 2022-07-30 ENCOUNTER — Ambulatory Visit: Payer: Medicare PPO | Attending: Family Medicine | Admitting: Physical Therapy

## 2022-07-30 ENCOUNTER — Other Ambulatory Visit: Payer: Self-pay

## 2022-07-30 DIAGNOSIS — R262 Difficulty in walking, not elsewhere classified: Secondary | ICD-10-CM | POA: Insufficient documentation

## 2022-07-30 DIAGNOSIS — G8929 Other chronic pain: Secondary | ICD-10-CM | POA: Insufficient documentation

## 2022-07-30 DIAGNOSIS — M5441 Lumbago with sciatica, right side: Secondary | ICD-10-CM | POA: Insufficient documentation

## 2022-07-30 DIAGNOSIS — M6281 Muscle weakness (generalized): Secondary | ICD-10-CM | POA: Diagnosis not present

## 2022-07-30 DIAGNOSIS — M5442 Lumbago with sciatica, left side: Secondary | ICD-10-CM | POA: Insufficient documentation

## 2022-07-30 NOTE — Patient Instructions (Signed)
Access Code: [email or web address] URL: [email or web address] Date: 07/30/2022 Prepared by: Garen Lah  Exercises - Supine Pelvic Tilt  - 1 x daily - 7 x weekly - 3 sets - 10 reps - Supine Single Knee to Chest Stretch  - 2 x daily - 7 x weekly - 1 sets - 5 reps - 10 hold - Supine Lower Trunk Rotation  - 2 x daily - 7 x weekly - 1 sets - 5 reps - 20 hold - Supine Hamstring Stretch with Strap  - 2 x daily - 7 x weekly - 1 sets - 3 reps - 20-62msec hold - Supine Piriformis Stretch with Foot on Ground  - 1 x daily - 7 x weekly - 1 sets - 5 reps - 10 sec hold  Garen Lah, PT, ATRIC Certified Exercise Expert for the Aging Adult  07/30/22 10:06 AM Phone: [phone] Fax: [phone]

## 2022-08-06 ENCOUNTER — Ambulatory Visit: Payer: Medicare PPO | Admitting: Physical Therapy

## 2022-08-06 DIAGNOSIS — M5441 Lumbago with sciatica, right side: Secondary | ICD-10-CM | POA: Diagnosis not present

## 2022-08-06 DIAGNOSIS — M5442 Lumbago with sciatica, left side: Secondary | ICD-10-CM | POA: Diagnosis not present

## 2022-08-06 DIAGNOSIS — R262 Difficulty in walking, not elsewhere classified: Secondary | ICD-10-CM

## 2022-08-06 DIAGNOSIS — G8929 Other chronic pain: Secondary | ICD-10-CM

## 2022-08-06 DIAGNOSIS — M6281 Muscle weakness (generalized): Secondary | ICD-10-CM | POA: Diagnosis not present

## 2022-08-06 NOTE — Therapy (Signed)
OUTPATIENT PHYSICAL THERAPY TREATMENT NOTE   Patient Name: Kristen Alvarado MRN: [id number] DOB:1955-11-18, 66 y.o., female Today's Date: 08/06/2022  PCP: Soundra Pilon, FNP   REFERRING PROVIDER: Daisy Floro, MD    END OF SESSION:   PT End of Session - 08/06/22 1414     Visit Number 2    Number of Visits 13    Date for PT Re-Evaluation 09/10/22    Authorization Type E-HUMANA MEDICARE/HUMANA MEDICARE CHOICE PPO Foto 6th and 10th visit progress note 10th    Progress Note Due on Visit 10    PT Start Time 1414    PT Stop Time 1500    PT Time Calculation (min) 46 min    Activity Tolerance Patient limited by pain;Patient tolerated treatment well    Behavior During Therapy [facility] for tasks assessed/performed             Past Medical History:  Diagnosis Date   Hypertension    No past surgical history on file. There are no problems to display for this patient.   REFERRING DIAG: M54.50 (ICD-10-CM) - Low back pain, unspecified    THERAPY DIAG:  Chronic bilateral low back pain with bilateral sciatica  Muscle weakness (generalized)  Difficulty in walking, not elsewhere classified  Rationale for Evaluation and Treatment Rehabilitation  PERTINENT HISTORY: HTN, DM, hypercholesteremia,obesity   PRECAUTIONS: N/A  SUBJECTIVE:                                                                                                                                                                                      SUBJECTIVE STATEMENT:  " I am still having a lot of pain at a 10/10 today." Pt Declines going to the ED.   PAIN:  Are you having pain? Yes: NPRS scale: 10/10 Pain location: all over the lo wback Pain description: sharp pain Aggravating factors: standing/ waking Relieving factors: Heat   OBJECTIVE: (objective measures completed at initial evaluation unless otherwise dated)  DIAGNOSTIC FINDINGS:  None recent   PATIENT SURVEYS:  FOTO 40% predicted 59%    SCREENING FOR RED FLAGS: Bowel or bladder incontinence: No     COGNITION: Overall cognitive status: Within functional limits for tasks assessed                          SENSATION: Tingling numbness into bil buttocks and pain into calf mx bil   MUSCLE LENGTH: Hamstrings: Right 60 deg; Left 60 deg     POSTURE: rounded shoulders, forward head, anterior pelvic tilt, and obesity Pt with flexed posture standing and sitting   PALPATION: Bil QL tenderness and  bil buttocks over iliac crests   LUMBAR ROM:    AROM eval  Flexion Able to touch shins bil  Extension 0  Right lateral flexion 1 inch above knee jt line  Left lateral flexion Knee jt line  Right rotation 50%  Left rotation 50%   (Blank rows = not tested)   LOWER EXTREMITY ROM:      Active  Right eval Left eval  Hip flexion 4 4  Hip extension 3+pain 3+ pain  Hip abduction 3- 3-  Hip adduction      Hip internal rotation 25 20  Hip external rotation      Knee flexion 4 4  Knee extension 4 4  Ankle dorsiflexion      Ankle plantarflexion      Ankle inversion      Ankle eversion       (Blank rows = not tested)   LOWER EXTREMITY MMT:     MMT Right eval Left eval  Hip flexion 70 standing 70 standing  Hip extension      Hip abduction      Hip adduction      Hip internal rotation 25 20  Hip external rotation 35 45  Knee flexion      Knee extension 4    Ankle dorsiflexion      Ankle plantarflexion 20/25 15/25  Ankle inversion      Ankle eversion       (Blank rows = not tested)   LUMBAR SPECIAL TESTS:  Straight leg raise test: Negative and Slump test: Negative   FUNCTIONAL TESTS:  5 time1610Marland KMar[facility] Healthcare LRoosv1610Marland KMusc[facility]Creek[facility] Medical 1610Marland KPremier Asc LRoo1610Marland KBaltimo1610Marland KCo1610Marland KClement J. Zablocki Va Me1610Marland KPacific Cataract And Las1610Marland KKindred Hospital [facility] North ShoRoosv<M1610Marland KJersey City Medic1610Marland KHill [facility] Memorial Surgery Ce1610Marland KSurgery Center [facility] Sante1610Marland KE[facility] 1610Marland KEncompass Health [facility] CiRoosv<MEASUREMWadie LessC1610Marland KNorth [facility] Gi Center Dba [facility] CentRoosv<M1610Marland KJersey [facility] Hosp1610Marland KUnity Medical And Surgical Hos1610Marland KLongleaf HospitRoosv<MEASUREMW1610Marland 1610Marland KLieber Correctional Ins1610Marland KSalem Memorial District HospitRoosvelt MaserBackesmaRoosv<MEASUREMWadie LessMercy Tiffin HospKentuckyitalSet designer45Ger assistance: Complete Independence Comments: Pt cannot tolerate standing or walking less than 3 minutes   TODAY'S TREATMENT:                       [facility] Adult PT Treatment:                                                 DATE: 08/06/2022 Therapeutic Exercise: Trialed REIS which pt could tolerated well so modified to prone on elbows 5 x 20 Prone press up 3 x 10  Reviewed and updated HEP today to include prone on elbow and prone press up.  Modalities: Prone with MHP along the lumbar spine combined with prone extension exercises  Self Care: Posture training with getting in/ out of bed using log rolling technique, verbal and tactile cues for proper form                                                                                                           DATE: 07-30-22   issue HEP and EVAL      PATIENT EDUCATION:  Education details: reviewed anatomy of the lumbar spine and  disc biomechanics and benefits of repeated extension.  Person educated: Patient Education method: Explanation, Demonstration, Tactile cues, Verbal cues, and Handouts Education comprehension: verbalized understanding, returned demonstration, verbal cues required, tactile cues required, and needs further education   HOME EXERCISE PROGRAM: Access Code: [email or web address] URL: [email or web address] Date: 08/06/2022 Prepared by: Lulu Riding  Exercises - Supine Pelvic Tilt  - 1 x daily - 7 x weekly - 3 sets - 10 reps - Supine Single Knee to Chest Stretch  - 2 x daily - 7 x weekly - 1 sets - 5 reps - 10 hold - Supine Lower Trunk Rotation  - 2 x daily - 7 x weekly - 1 sets - 5 reps - 20 hold - Supine Hamstring Stretch with Strap  - 2 x daily - 7 x weekly - 1 sets - 3 reps - 20-27msec hold - Supine Piriformis Stretch with Foot on Ground  - 1 x daily - 7 x weekly - 1 sets - 5 reps - 10 sec hold - Prone Press Up On Elbows  - 1 x daily - 7 x weekly - 2 sets - 10 reps - Prone Press Up  - 1 x daily - 7 x weekly - 2 sets - 10 reps   ASSESSMENT:   CLINICAL IMPRESSION: Pt arrives to PT noting continued 10/10 pain in the low back with referral down bil LE. She declined going to the ED despite the elevated  pain.   trailed repeated extension in standing which she didn't tolerate well. Progressed to prone on elbows/ press ups which pt noted centralization. Updated HEP for prone extension exercises. Reviewed posture more specifically with log rolling to help prevent issues/ stress in the back. End of session pt reported pain dropped from a 10/10 to a 7/10.    OBJECTIVE IMPAIRMENTS: decreased mobility, difficulty walking, decreased ROM, decreased strength, increased fascial restrictions, impaired flexibility, improper body mechanics, postural dysfunction, obesity, and pain.    ACTIVITY LIMITATIONS: bending, standing, squatting, stairs, and transfers   PARTICIPATION LIMITATIONS: meal prep, cleaning, and laundry   PERSONAL FACTORS: HTN, DM, hypercholesteremia,obesity are also affecting patient's functional outcome.    REHAB POTENTIAL: Good   CLINICAL DECISION MAKING: Evolving/moderate complexity   EVALUATION COMPLEXITY: Moderate     GOALS: Goals reviewed with patient? Yes   SHORT TERM GOALS: Target date: August 20, 2022     Independent with initial HEP Baseline:no knowledge Goal status: INITIAL   2.  Report pain decrease at rest from  10 /10 to  5 /10 Baseline: 10/10 Goal status: INITIAL   3.  Demonstrate understanding of neutral posture and be more conscious of position and posture throughout the day.  Baseline: Pt with flexed posture and sitting on sacrum Goal status: INITIAL   4.  Pt will be able to control descent with sitting transfer Baseline: Pt unable to control descent with sitting due to pain 10/10 Goal status: INITIAL       LONG TERM GOALS: Target date: Sep 10, 2022   Pt will be independent with advanced HEP Baseline: no knowledge Goal status: INITIAL   2.  Demonstrate and verbalize techniques to reduce the risk of re-injury including: lifting, posture, body mechanics Baseline: no knowledge Goal status: INITIAL   3.  FOTO will improve from  40%  to  59%    indicating improved functional mobility.  Baseline: eval 40% Goal status: INITIAL   4.  Pt will reduce 5 x STS to 13 sec or less to show improved LE  strength Baseline: 20.56 sec eval Goal status: INITIAL   5.  Pt will demonstrate safe floor to stand transfer to show improved flexibility and increase safety living alone Baseline: not able to transfer from floor to stand Goal status: INITIAL   6.  Pt will be able to perform household chores with pain 3/10 or less Baseline: 10/10 Goal status: INITIAL   PLAN:   PT FREQUENCY: 2x/week   PT DURATION: 6 weeks   PLANNED INTERVENTIONS: Therapeutic exercises, Therapeutic activity, Neuromuscular re-education, Balance training, Gait training, Patient/Family education, Self Care, Joint mobilization, Stair training, Dry Needling, Electrical stimulation, Spinal mobilization, Cryotherapy, Moist heat, Taping, Traction, Ionotophoresis 4mg /ml Dexamethasone, Manual therapy, and Re-evaluation.   PLAN FOR NEXT SESSION: TPDN , HEP review, Manual assess response to prone extension exercises.     Lenardo Westwood PT, DPT, LAT, ATC  08/06/22  3:03 PM

## 2022-08-08 ENCOUNTER — Encounter: Payer: Self-pay | Admitting: Physical Therapy

## 2022-08-08 ENCOUNTER — Ambulatory Visit: Payer: Medicare PPO | Admitting: Physical Therapy

## 2022-08-08 DIAGNOSIS — G8929 Other chronic pain: Secondary | ICD-10-CM | POA: Diagnosis not present

## 2022-08-08 DIAGNOSIS — M6281 Muscle weakness (generalized): Secondary | ICD-10-CM | POA: Diagnosis not present

## 2022-08-08 DIAGNOSIS — M5442 Lumbago with sciatica, left side: Secondary | ICD-10-CM | POA: Diagnosis not present

## 2022-08-08 DIAGNOSIS — R262 Difficulty in walking, not elsewhere classified: Secondary | ICD-10-CM | POA: Diagnosis not present

## 2022-08-08 DIAGNOSIS — M5441 Lumbago with sciatica, right side: Secondary | ICD-10-CM | POA: Diagnosis not present

## 2022-08-08 NOTE — Therapy (Addendum)
OUTPATIENT PHYSICAL THERAPY TREATMENT NOTE/Discharge Note PHYSICAL THERAPY DISCHARGE SUMMARY  Visits from Start of Care: 3  Current functional level related to goals / functional outcomes: unknown   Remaining deficits: Unknown  Pt had MRI and is not sure she will return   Education / Equipment: Initial HEP   Patient agrees to discharge. Patient goals were not met. Patient is being discharged due to  Pt .  Had MRI and was not sure she would return and is not past POC    Patient Name: Kristen Alvarado MRN: [id number] DOB:[birth year], 66 y.o., female Today's Date: 08/08/2022  PCP: Soundra Pilon, FNP   REFERRING PROVIDER: Daisy Floro, MD    END OF SESSION:   PT End of Session - 08/08/22 0934     Visit Number 3    Number of Visits 13    Date for PT Re-Evaluation 09/10/22    Authorization Type E-HUMANA MEDICARE/HUMANA MEDICARE CHOICE PPO Foto 6th and 10th visit progress note 10th    Authorization Time Period 07/30/22-09/21/22; 12 visits    PT Start Time 0930    PT Stop Time 1023    PT Time Calculation (min) 53 min             Past Medical History:  Diagnosis Date   Hypertension    History reviewed. No pertinent surgical history. There are no problems to display for this patient.   REFERRING DIAG: M54.50 (ICD-10-CM) - Low back pain, unspecified    THERAPY DIAG:  Chronic bilateral low back pain with bilateral sciatica  Muscle weakness (generalized)  Difficulty in walking, not elsewhere classified  Rationale for Evaluation and Treatment Rehabilitation  PERTINENT HISTORY: HTN, DM, hypercholesteremia,obesity   PRECAUTIONS: N/A  SUBJECTIVE:                                                                                                                                                                                      SUBJECTIVE STATEMENT:  "today is worse than yesterday. I was a 6/10 yesterday and today I am a 9/10. "    PAIN:  Are you having pain?  Yes: NPRS scale: 9/10 Pain location: all over the lo wback Pain description: sharp pain Aggravating factors: standing/ waking Relieving factors: Heat   OBJECTIVE: (objective measures completed at initial evaluation unless otherwise dated)  DIAGNOSTIC FINDINGS:  None recent   PATIENT SURVEYS:  FOTO 40% predicted 59%   SCREENING FOR RED FLAGS: Bowel or bladder incontinence: No     COGNITION: Overall cognitive status: Within functional limits for tasks assessed  SENSATION: Tingling numbness into bil buttocks and pain into calf mx bil   MUSCLE LENGTH: Hamstrings: Right 60 deg; Left 60 deg     POSTURE: rounded shoulders, forward head, anterior pelvic tilt, and obesity Pt with flexed posture standing and sitting   PALPATION: Bil QL tenderness and bil buttocks over iliac crests   LUMBAR ROM:    AROM eval  Flexion Able to touch shins bil  Extension 0  Right lateral flexion 1 inch above knee jt line  Left lateral flexion Knee jt line  Right rotation 50%  Left rotation 50%   (Blank rows = not tested)   LOWER EXTREMITY ROM:      Active  Right eval Left eval  Hip flexion 4 4  Hip extension 3+pain 3+ pain  Hip abduction 3- 3-  Hip adduction      Hip internal rotation 25 20  Hip external rotation      Knee flexion 4 4  Knee extension 4 4  Ankle dorsiflexion      Ankle plantarflexion      Ankle inversion      Ankle eversion       (Blank rows = not tested)   LOWER EXTREMITY MMT:     MMT Right eval Left eval  Hip flexion 70 standing 70 standing  Hip extension      Hip abduction      Hip adduction      Hip internal rotation 25 20  Hip external rotation 35 45  Knee flexion      Knee extension 4    Ankle dorsiflexion      Ankle plantarflexion 20/25 15/25  Ankle inversion      Ankle eversion       (Blank rows = not tested)   LUMBAR SPECIAL TESTS:  Straight leg raise test: Negative and Slump test: Negative   FUNCTIONAL TESTS:   5 times sit to stand: 20.56 sec 2MWT 291 ft Normal 546 ft   GAIT: Distance walked: 175 feet Assistive device utilized: None Level of assistance: Complete Independence Comments: Pt cannot tolerate standing or walking less than 3 minutes   TODAY'S TREATMENT:                      [facility] Adult PT Treatment:                                                DATE: 08/08/22 Therapeutic Exercise: Standing repeated lumbar extension- no change in sx Prone lying over pillow Gluteal squeezes  Prone h/s curls Prone hip ext x 10 each  POE x 60 sec Prone press up 10 x 3  LTR Piriformis stretch - difficulty getting into position but able to complete short stretches 90/90 hamstring stretch to avoid back strain, supine with strap 10 sec x  5 each   Modalities: HMP in sitting to proximal hamstrings x 15 minutes     [facility] Adult PT Treatment:                                                DATE: 08/06/2022 Therapeutic Exercise: Trialed REIS which pt could tolerated well so modified to prone on elbows 5 x 20 Prone  press up 3 x 10  Reviewed and updated HEP today to include prone on elbow and prone press up.  Modalities: Prone with MHP along the lumbar spine combined with prone extension exercises  Self Care: Posture training with getting in/ out of bed using log rolling technique, verbal and tactile cues for proper form                                                                                                           DATE: 07-30-22   issue HEP and EVAL      PATIENT EDUCATION:  Education details: reviewed anatomy of the lumbar spine and disc biomechanics and benefits of repeated extension.  Person educated: Patient Education method: Explanation, Demonstration, Tactile cues, Verbal cues, and Handouts Education comprehension: verbalized understanding, returned demonstration, verbal cues required, tactile cues required, and needs further education   HOME EXERCISE PROGRAM: Access Code:  [email or web address] URL: [email or web address] Date: 08/06/2022 Prepared by: Lulu Riding  Exercises - Supine Pelvic Tilt  - 1 x daily - 7 x weekly - 3 sets - 10 reps - Supine Single Knee to Chest Stretch  - 2 x daily - 7 x weekly - 1 sets - 5 reps - 10 hold - Supine Lower Trunk Rotation  - 2 x daily - 7 x weekly - 1 sets - 5 reps - 20 hold - Supine Hamstring Stretch with Strap  - 2 x daily - 7 x weekly - 1 sets - 3 reps - 20-53msec hold - Supine Piriformis Stretch with Foot on Ground  - 1 x daily - 7 x weekly - 1 sets - 5 reps - 10 sec hold - Prone Press Up On Elbows  - 1 x daily - 7 x weekly - 2 sets - 10 reps - Prone Press Up  - 1 x daily - 7 x weekly - 2 sets - 10 reps   ASSESSMENT:   CLINICAL IMPRESSION: Pt arrives to PT noting continued 9/10 pain in the low back with referral down bil LE to back of thighs. She reports that yesterday her pain was lower, only 6/10 and she did not perform any HEP yesterday. Trailed repeated extension in standing which she reported catching in back of thighs.  Progressed to prone lying with stabilization exercises. Then progressed with prone on elbows/ press ups which pt noted to still have tingling in right LE. Encouraged her to perform prone HEP over next several days as this seemed to help her after last visit and then pain returned. HMP applied to posterior thighs in sitting per pt request.     OBJECTIVE IMPAIRMENTS: decreased mobility, difficulty walking, decreased ROM, decreased strength, increased fascial restrictions, impaired flexibility, improper body mechanics, postural dysfunction, obesity, and pain.    ACTIVITY LIMITATIONS: bending, standing, squatting, stairs, and transfers   PARTICIPATION LIMITATIONS: meal prep, cleaning, and laundry   PERSONAL FACTORS: HTN, DM, hypercholesteremia,obesity are also affecting patient's functional outcome.    REHAB POTENTIAL: Good   CLINICAL DECISION MAKING: Evolving/moderate complexity    EVALUATION COMPLEXITY: Moderate  GOALS: Goals reviewed with patient? Yes   SHORT TERM GOALS: Target date: August 20, 2022     Independent with initial HEP Baseline:no knowledge Goal status: INITIAL   2.  Report pain decrease at rest from  10 /10 to  5 /10 Baseline: 10/10 Goal status: INITIAL   3.  Demonstrate understanding of neutral posture and be more conscious of position and posture throughout the day.  Baseline: Pt with flexed posture and sitting on sacrum Goal status: INITIAL   4.  Pt will be able to control descent with sitting transfer Baseline: Pt unable to control descent with sitting due to pain 10/10 Goal status: INITIAL       LONG TERM GOALS: Target date: Sep 10, 2022   Pt will be independent with advanced HEP Baseline: no knowledge Goal status: INITIAL   2.  Demonstrate and verbalize techniques to reduce the risk of re-injury including: lifting, posture, body mechanics Baseline: no knowledge Goal status: INITIAL   3.  FOTO will improve from  40%  to  59%   indicating improved functional mobility.  Baseline: eval 40% Goal status: INITIAL   4.  Pt will reduce 5 x STS to 13 sec or less to show improved LE strength Baseline: 20.56 sec eval Goal status: INITIAL   5.  Pt will demonstrate safe floor to stand transfer to show improved flexibility and increase safety living alone Baseline: not able to transfer from floor to stand Goal status: INITIAL   6.  Pt will be able to perform household chores with pain 3/10 or less Baseline: 10/10 Goal status: INITIAL   PLAN:   PT FREQUENCY: 2x/week   PT DURATION: 6 weeks   PLANNED INTERVENTIONS: Therapeutic exercises, Therapeutic activity, Neuromuscular re-education, Balance training, Gait training, Patient/Family education, Self Care, Joint mobilization, Stair training, Dry Needling, Electrical stimulation, Spinal mobilization, Cryotherapy, Moist heat, Taping, Traction, Ionotophoresis 4mg /ml  Dexamethasone, Manual therapy, and Re-evaluation.   PLAN FOR NEXT SESSION: TPDN , HEP review, Manual assess response to prone extension exercises.     , PTA 08/08/22 2:05 PM Phone: [phone] Fax: [phone]    [phone], PT, [facility] Certified Exercise Expert for the Aging Adult  10/01/22 2:32 PM Phone: [phone] Fax: [phone]

## 2022-08-14 ENCOUNTER — Other Ambulatory Visit: Payer: Self-pay | Admitting: Family Medicine

## 2022-08-14 ENCOUNTER — Ambulatory Visit
Admission: RE | Admit: 2022-08-14 | Discharge: 2022-08-14 | Disposition: A | Discharge status: Home / Self Care | Payer: Medicare PPO | Source: Ambulatory Visit | Attending: Family Medicine | Admitting: Family Medicine

## 2022-08-14 ENCOUNTER — Ambulatory Visit: Payer: Medicare PPO | Admitting: Physical Therapy

## 2022-08-14 DIAGNOSIS — M545 Low back pain, unspecified: Secondary | ICD-10-CM

## 2022-08-14 DIAGNOSIS — M25559 Pain in unspecified hip: Secondary | ICD-10-CM

## 2022-08-14 DIAGNOSIS — M4316 Spondylolisthesis, lumbar region: Secondary | ICD-10-CM | POA: Diagnosis not present

## 2022-08-14 DIAGNOSIS — M546 Pain in thoracic spine: Secondary | ICD-10-CM | POA: Diagnosis not present

## 2022-08-15 DIAGNOSIS — Z6841 Body Mass Index (BMI) 40.0 and over, adult: Secondary | ICD-10-CM | POA: Diagnosis not present

## 2022-08-15 DIAGNOSIS — E1169 Type 2 diabetes mellitus with other specified complication: Secondary | ICD-10-CM | POA: Diagnosis not present

## 2022-08-15 DIAGNOSIS — E7800 Pure hypercholesterolemia, unspecified: Secondary | ICD-10-CM | POA: Diagnosis not present

## 2022-08-15 DIAGNOSIS — M545 Low back pain, unspecified: Secondary | ICD-10-CM | POA: Diagnosis not present

## 2022-08-15 DIAGNOSIS — I10 Essential (primary) hypertension: Secondary | ICD-10-CM | POA: Diagnosis not present

## 2022-08-16 ENCOUNTER — Ambulatory Visit: Payer: Medicare PPO | Admitting: Physical Therapy

## 2022-08-21 ENCOUNTER — Ambulatory Visit: Payer: Medicare PPO | Admitting: Physical Therapy

## 2022-08-23 ENCOUNTER — Ambulatory Visit: Payer: Medicare PPO | Admitting: Physical Therapy

## 2022-08-28 ENCOUNTER — Ambulatory Visit: Payer: Medicare PPO | Admitting: Physical Therapy

## 2022-08-28 DIAGNOSIS — M4310 Spondylolisthesis, site unspecified: Secondary | ICD-10-CM | POA: Diagnosis not present

## 2022-08-28 DIAGNOSIS — M5416 Radiculopathy, lumbar region: Secondary | ICD-10-CM | POA: Diagnosis not present

## 2022-08-28 DIAGNOSIS — M47816 Spondylosis without myelopathy or radiculopathy, lumbar region: Secondary | ICD-10-CM | POA: Diagnosis not present

## 2022-08-29 ENCOUNTER — Other Ambulatory Visit: Payer: Self-pay | Admitting: Physical Medicine and Rehabilitation

## 2022-08-29 DIAGNOSIS — M5416 Radiculopathy, lumbar region: Secondary | ICD-10-CM

## 2022-08-30 ENCOUNTER — Encounter: Payer: Medicare PPO | Admitting: Physical Therapy

## 2022-09-04 ENCOUNTER — Encounter: Payer: Medicare PPO | Admitting: Physical Therapy

## 2022-09-06 ENCOUNTER — Encounter: Payer: Medicare PPO | Admitting: Physical Therapy

## 2022-09-11 ENCOUNTER — Ambulatory Visit: Payer: Medicare PPO | Admitting: Physical Therapy

## 2022-09-13 ENCOUNTER — Encounter: Payer: Medicare PPO | Admitting: Physical Therapy

## 2022-09-17 ENCOUNTER — Encounter: Payer: Medicare PPO | Admitting: Physical Therapy

## 2022-09-22 ENCOUNTER — Ambulatory Visit
Admission: RE | Admit: 2022-09-22 | Discharge: 2022-09-22 | Disposition: A | Discharge status: Home / Self Care | Payer: Medicare PPO | Source: Ambulatory Visit | Attending: Physical Medicine and Rehabilitation | Admitting: Physical Medicine and Rehabilitation

## 2022-09-22 DIAGNOSIS — M48061 Spinal stenosis, lumbar region without neurogenic claudication: Secondary | ICD-10-CM | POA: Diagnosis not present

## 2022-09-22 DIAGNOSIS — M5127 Other intervertebral disc displacement, lumbosacral region: Secondary | ICD-10-CM | POA: Diagnosis not present

## 2022-09-22 DIAGNOSIS — R29898 Other symptoms and signs involving the musculoskeletal system: Secondary | ICD-10-CM | POA: Diagnosis not present

## 2022-09-22 DIAGNOSIS — M5416 Radiculopathy, lumbar region: Secondary | ICD-10-CM

## 2022-09-26 DIAGNOSIS — M5416 Radiculopathy, lumbar region: Secondary | ICD-10-CM | POA: Diagnosis not present

## 2022-09-26 DIAGNOSIS — M4316 Spondylolisthesis, lumbar region: Secondary | ICD-10-CM | POA: Diagnosis not present

## 2022-09-26 DIAGNOSIS — M5126 Other intervertebral disc displacement, lumbar region: Secondary | ICD-10-CM | POA: Diagnosis not present

## 2022-10-03 DIAGNOSIS — H40013 Open angle with borderline findings, low risk, bilateral: Secondary | ICD-10-CM | POA: Diagnosis not present

## 2022-10-03 DIAGNOSIS — H40053 Ocular hypertension, bilateral: Secondary | ICD-10-CM | POA: Diagnosis not present

## 2022-10-07 ENCOUNTER — Other Ambulatory Visit: Payer: Self-pay | Admitting: Neurological Surgery

## 2022-10-08 DIAGNOSIS — E876 Hypokalemia: Secondary | ICD-10-CM | POA: Diagnosis not present

## 2022-10-08 DIAGNOSIS — E7800 Pure hypercholesterolemia, unspecified: Secondary | ICD-10-CM | POA: Diagnosis not present

## 2022-10-08 DIAGNOSIS — N1831 Chronic kidney disease, stage 3a: Secondary | ICD-10-CM | POA: Diagnosis not present

## 2022-10-08 DIAGNOSIS — Z0000 Encounter for general adult medical examination without abnormal findings: Secondary | ICD-10-CM | POA: Diagnosis not present

## 2022-10-08 DIAGNOSIS — E1169 Type 2 diabetes mellitus with other specified complication: Secondary | ICD-10-CM | POA: Diagnosis not present

## 2022-10-08 DIAGNOSIS — I10 Essential (primary) hypertension: Secondary | ICD-10-CM | POA: Diagnosis not present

## 2022-10-09 NOTE — Progress Notes (Signed)
Surgical Instructions    Your procedure is scheduled on Monday, 10/21/22.  Report to [facility] Main Entrance "A" at 10:45 A.M., then check in with the Admitting office.  Call this number if you have problems the morning of surgery:  [phone]   If you have any questions prior to your surgery date call [phone]: Open Monday-Friday 8am-4pm If you experience any cold or flu symptoms such as cough, fever, chills, shortness of breath, etc. between now and your scheduled surgery, please notify us at the above number     Remember:  Do not eat or drink after midnight the night before your surgery    Take these medicines the morning of surgery with A SIP OF WATER:  atorvastatin (LIPITOR)  cloNIDine (CATAPRES)  timolol (TIMOPTIC) eye drops HYDROcodone-acetaminophen (NORCO/VICODIN) if needed  As of today, STOP taking any Aspirin (unless otherwise instructed by your surgeon) Aleve, Naproxen, Ibuprofen, Motrin, Advil, Goody's, BC's, all herbal medications, fish oil, and all vitamins.  Do not take metFORMIN (GLUCOPHAGE-XR) the day of surgery.           Do not wear jewelry or makeup. Do not wear lotions, powders, perfumes or deodorant. Do not shave 48 hours prior to surgery.  Do not bring valuables to the hospital. Do not wear nail polish, gel polish, artificial nails, or any other type of covering on natural nails (fingers and toes) If you have artificial nails or gel coating that need to be removed by a nail salon, please have this removed prior to surgery. Artificial nails or gel coating may interfere with anesthesia's ability to adequately monitor your vital signs.  [facility] is not responsible for any belongings or valuables.    Do NOT Smoke (Tobacco/Vaping)  24 hours prior to your procedure  If you use a CPAP at night, you may bring your mask for your overnight stay.   Contacts, glasses, hearing aids, dentures or partials may not be worn into surgery, please bring cases for  these belongings   For patients admitted to the hospital, discharge time will be determined by your treatment team.   Patients discharged the day of surgery will not be allowed to drive home, and someone needs to stay with them for 24 hours.   SURGICAL WAITING ROOM VISITATION Patients having surgery or a procedure may have no more than 2 support people in the waiting area - these visitors may rotate.   Children under the age of 80 must have an adult with them who is not the patient. If the patient needs to stay at the hospital during part of their recovery, the visitor guidelines for inpatient rooms apply. Pre-op nurse will coordinate an appropriate time for 1 support person to accompany patient in pre-op.  This support person may not rotate.   Please refer to [email or web address] for the visitor guidelines for Inpatients (after your surgery is over and you are in a regular room).    Special instructions:    Oral Hygiene is also important to reduce your risk of infection.  Remember - BRUSH YOUR TEETH THE MORNING OF SURGERY WITH YOUR REGULAR TOOTHPASTE   [place]- Preparing For Surgery  Before surgery, you can play an important role. Because skin is not sterile, your skin needs to be as free of germs as possible. You can reduce the number of germs on your skin by washing with CHG (chlorahexidine gluconate) Soap before surgery.  CHG is an antiseptic cleaner which kills germs and bonds with the skin to  continue killing germs even after washing.     Please do not use if you have an allergy to CHG or antibacterial soaps. If your skin becomes reddened/irritated stop using the CHG.  Do not shave (including legs and underarms) for at least 48 hours prior to first CHG shower. It is OK to shave your face.  Please follow these instructions carefully.     Shower the NIGHT BEFORE SURGERY and the MORNING OF SURGERY with CHG Soap.   If you chose  to wash your hair, wash your hair first as usual with your normal shampoo. After you shampoo, rinse your hair and body thoroughly to remove the shampoo.  Then [employer] and genitals (private parts) with your normal soap and rinse thoroughly to remove soap.  After that Use CHG Soap as you would any other liquid soap. You can apply CHG directly to the skin and wash gently with a scrungie or a clean washcloth.   Apply the CHG Soap to your body ONLY FROM THE NECK DOWN.  Do not use on open wounds or open sores. Avoid contact with your eyes, ears, mouth and genitals (private parts). Wash Face and genitals (private parts)  with your normal soap.   Wash thoroughly, paying special attention to the area where your surgery will be performed.  Thoroughly rinse your body with warm water from the neck down.  DO NOT shower/wash with your normal soap after using and rinsing off the CHG Soap.  Pat yourself dry with a CLEAN TOWEL.  Wear CLEAN PAJAMAS to bed the night before surgery  Place CLEAN SHEETS on your bed the night before your surgery  DO NOT SLEEP WITH PETS.   Day of Surgery: Take a shower with CHG soap. Wear Clean/Comfortable clothing the morning of surgery Do not apply any deodorants/lotions.   Remember to brush your teeth WITH YOUR REGULAR TOOTHPASTE.    If you received a COVID test during your pre-op visit, it is requested that you wear a mask when out in public, stay away from anyone that may not be feeling well, and notify your surgeon if you develop symptoms. If you have been in contact with anyone that has tested positive in the last 10 days, please notify your surgeon.    Please read over the following fact sheets that you were given.

## 2022-10-10 ENCOUNTER — Encounter (HOSPITAL_COMMUNITY): Payer: Self-pay

## 2022-10-10 ENCOUNTER — Other Ambulatory Visit: Payer: Self-pay

## 2022-10-10 ENCOUNTER — Encounter (HOSPITAL_COMMUNITY)
Admission: RE | Admit: 2022-10-10 | Discharge: 2022-10-10 | Disposition: A | Discharge status: Home / Self Care | Payer: Medicare PPO | Source: Ambulatory Visit | Attending: Neurological Surgery | Admitting: Neurological Surgery

## 2022-10-10 VITALS — BP 168/76 | HR 62 | Temp 98.3°F | Resp 18 | Ht 66.0 in | Wt 240.4 lb

## 2022-10-10 DIAGNOSIS — Z01818 Encounter for other preprocedural examination: Secondary | ICD-10-CM | POA: Insufficient documentation

## 2022-10-10 DIAGNOSIS — I2510 Atherosclerotic heart disease of native coronary artery without angina pectoris: Secondary | ICD-10-CM | POA: Insufficient documentation

## 2022-10-10 HISTORY — DX: Type 2 diabetes mellitus without complications: E11.9

## 2022-10-10 LAB — BASIC METABOLIC PANEL
Anion gap: 7 (ref 5–15)
BUN: 11 mg/dL (ref 8–23)
CO2: 30 mmol/L (ref 22–32)
Calcium: 9.2 mg/dL (ref 8.9–10.3)
Chloride: 103 mmol/L (ref 98–111)
Creatinine, Ser: 0.88 mg/dL (ref 0.44–1.00)
GFR, Estimated: >60 mL/min (ref >60)
Glucose, Bld: 146 mg/dL — ABNORMAL HIGH (ref 70–99)
Potassium: 3.4 mmol/L — ABNORMAL LOW (ref 3.5–5.1)
Sodium: 140 mmol/L (ref 135–145)

## 2022-10-10 LAB — CBC
HCT: 36.1 % (ref 36.0–46.0)
Hemoglobin: 11.8 g/dL — ABNORMAL LOW (ref 12.0–15.0)
MCH: 26.7 pg (ref 26.0–34.0)
MCHC: 32.7 g/dL (ref 30.0–36.0)
MCV: 81.7 fL (ref 80.0–100.0)
Platelets: 282 10*3/uL (ref 150–400)
RBC: 4.42 MIL/uL (ref 3.87–5.11)
RDW: 15.3 % (ref 11.5–15.5)
WBC: 5.5 10*3/uL (ref 4.0–10.5)
nRBC: 0 % (ref 0.0–0.2)

## 2022-10-10 LAB — SURGICAL PCR SCREEN
MRSA, PCR: NEGATIVE
Staphylococcus aureus: NEGATIVE

## 2022-10-10 LAB — TYPE AND SCREEN
ABO/RH(D): B POS
Antibody Screen: NEGATIVE

## 2022-10-10 LAB — GLUCOSE, CAPILLARY: Glucose-Capillary: 178 mg/dL — ABNORMAL HIGH (ref 70–99)

## 2022-10-10 LAB — HEMOGLOBIN A1C
Hgb A1c MFr Bld: 6.4 % — ABNORMAL HIGH (ref 4.8–5.6)
Mean Plasma Glucose: 136.98 mg/dL

## 2022-10-10 LAB — PROTIME-INR
INR: 1 (ref 0.8–1.2)
Prothrombin Time: 13.3 seconds (ref 11.4–15.2)

## 2022-10-10 NOTE — Progress Notes (Signed)
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  Does not monitor CBG at home.   COVID TEST- not needed   Anesthesia review: no  Patient denies shortness of breath, fever, cough and chest pain at PAT appointment   All instructions explained to the patient, with a verbal understanding of the material. Patient agrees to go over the instructions while at home for a better understanding. Patient also instructed to self quarantine after being tested for COVID-19. The opportunity to ask questions was provided. '

## 2022-10-16 DIAGNOSIS — M5116 Intervertebral disc disorders with radiculopathy, lumbar region: Secondary | ICD-10-CM | POA: Diagnosis not present

## 2022-10-18 NOTE — Progress Notes (Signed)
left voicemail with new arrival time of 0900 on monday

## 2022-10-20 NOTE — Anesthesia Preprocedure Evaluation (Signed)
Anesthesia Evaluation  Patient identified by MRN, date of birth, ID band Patient awake    Reviewed: Allergy & Precautions, NPO status , Patient's Chart, lab work & pertinent test results  History of Anesthesia Complications Negative for: history of anesthetic complications  Airway Mallampati: II  TM Distance: >3 FB Neck ROM: Full    Dental  (+) Dental Advisory Given, Partial Upper   Pulmonary neg pulmonary ROS   Pulmonary exam normal        Cardiovascular hypertension, Pt. on medications Normal cardiovascular exam     Neuro/Psych negative neurological ROS     GI/Hepatic negative GI ROS, Neg liver ROS,,,  Endo/Other  diabetes    Renal/GU negative Renal ROS     Musculoskeletal negative musculoskeletal ROS (+)    Abdominal   Peds  Hematology negative hematology ROS (+)   Anesthesia Other Findings   Reproductive/Obstetrics                             Anesthesia Physical Anesthesia Plan  ASA: 2  Anesthesia Plan: General   Post-op Pain Management: Tylenol PO (pre-op)* and Toradol IV (intra-op)*   Induction: Intravenous  PONV Risk Score and Plan: 4 or greater and Ondansetron, Dexamethasone, Midazolam and Scopolamine patch - Pre-op  Airway Management Planned: Oral ETT  Additional Equipment:   Intra-op Plan:   Post-operative Plan: Extubation in OR  Informed Consent: I have reviewed the patients History and Physical, chart, labs and discussed the procedure including the risks, benefits and alternatives for the proposed anesthesia with the patient or authorized representative who has indicated his/her understanding and acceptance.     Dental advisory given  Plan Discussed with: Anesthesiologist and CRNA  Anesthesia Plan Comments:         Anesthesia Quick Evaluation

## 2022-10-21 ENCOUNTER — Observation Stay (HOSPITAL_COMMUNITY)
Admission: RE | Admit: 2022-10-21 | Discharge: 2022-10-23 | Disposition: A | Discharge status: Skilled Nursing Facility | Payer: Medicare PPO | Attending: Neurological Surgery | Admitting: Neurological Surgery

## 2022-10-21 ENCOUNTER — Encounter (HOSPITAL_COMMUNITY)
Admission: RE | Disposition: A | Discharge status: Skilled Nursing Facility | Payer: Self-pay | Source: Home / Self Care | Attending: Neurological Surgery

## 2022-10-21 ENCOUNTER — Ambulatory Visit (HOSPITAL_BASED_OUTPATIENT_CLINIC_OR_DEPARTMENT_OTHER): Payer: Medicare PPO | Admitting: Anesthesiology

## 2022-10-21 ENCOUNTER — Ambulatory Visit (HOSPITAL_COMMUNITY): Payer: Medicare PPO | Admitting: Anesthesiology

## 2022-10-21 ENCOUNTER — Other Ambulatory Visit: Payer: Self-pay

## 2022-10-21 ENCOUNTER — Encounter (HOSPITAL_COMMUNITY): Payer: Self-pay | Admitting: Neurological Surgery

## 2022-10-21 ENCOUNTER — Ambulatory Visit (HOSPITAL_COMMUNITY): Payer: Medicare PPO

## 2022-10-21 DIAGNOSIS — M5116 Intervertebral disc disorders with radiculopathy, lumbar region: Secondary | ICD-10-CM | POA: Diagnosis not present

## 2022-10-21 DIAGNOSIS — E119 Type 2 diabetes mellitus without complications: Secondary | ICD-10-CM | POA: Diagnosis not present

## 2022-10-21 DIAGNOSIS — I10 Essential (primary) hypertension: Secondary | ICD-10-CM | POA: Diagnosis not present

## 2022-10-21 DIAGNOSIS — M4316 Spondylolisthesis, lumbar region: Secondary | ICD-10-CM | POA: Diagnosis not present

## 2022-10-21 DIAGNOSIS — M5117 Intervertebral disc disorders with radiculopathy, lumbosacral region: Secondary | ICD-10-CM | POA: Diagnosis not present

## 2022-10-21 DIAGNOSIS — Z981 Arthrodesis status: Secondary | ICD-10-CM

## 2022-10-21 DIAGNOSIS — M4807 Spinal stenosis, lumbosacral region: Secondary | ICD-10-CM | POA: Insufficient documentation

## 2022-10-21 DIAGNOSIS — M5417 Radiculopathy, lumbosacral region: Secondary | ICD-10-CM | POA: Diagnosis not present

## 2022-10-21 DIAGNOSIS — R2681 Unsteadiness on feet: Secondary | ICD-10-CM | POA: Diagnosis not present

## 2022-10-21 DIAGNOSIS — Z7984 Long term (current) use of oral hypoglycemic drugs: Secondary | ICD-10-CM | POA: Insufficient documentation

## 2022-10-21 DIAGNOSIS — Z79899 Other long term (current) drug therapy: Secondary | ICD-10-CM | POA: Diagnosis not present

## 2022-10-21 DIAGNOSIS — M48061 Spinal stenosis, lumbar region without neurogenic claudication: Secondary | ICD-10-CM | POA: Diagnosis not present

## 2022-10-21 LAB — GLUCOSE, CAPILLARY
Glucose-Capillary: 147 mg/dL — ABNORMAL HIGH (ref 70–99)
Glucose-Capillary: 135 mg/dL — ABNORMAL HIGH (ref 70–99)
Glucose-Capillary: 214 mg/dL — ABNORMAL HIGH (ref 70–99)

## 2022-10-21 LAB — ABO/RH: ABO/RH(D): B POS

## 2022-10-21 SURGERY — POSTERIOR LUMBAR FUSION 2 LEVEL
Anesthesia: General | Site: Spine Lumbar

## 2022-10-21 MED ORDER — SCOPOLAMINE 1 MG/3DAYS TD PT72
MEDICATED_PATCH | TRANSDERMAL | Status: AC
  Administered 2022-10-21: 1.5 mg via TRANSDERMAL
  Filled 2022-10-21: qty 1

## 2022-10-21 MED ORDER — HYDRALAZINE HCL 20 MG/ML IJ SOLN
INTRAMUSCULAR | Status: AC
  Administered 2022-10-21: 10 mg via INTRAVENOUS
  Filled 2022-10-21: qty 1

## 2022-10-21 MED ORDER — HYDRALAZINE HCL 20 MG/ML IJ SOLN: 10.0000 mg | Freq: Once | INTRAMUSCULAR | Status: AC

## 2022-10-21 MED ORDER — ACETAMINOPHEN 500 MG PO TABS
ORAL_TABLET | ORAL | Status: AC
  Administered 2022-10-21: 1000 mg via ORAL
  Filled 2022-10-21: qty 2

## 2022-10-21 MED ORDER — THROMBIN 20000 UNITS EX SOLR
CUTANEOUS | Status: AC
  Filled 2022-10-21: qty 20000

## 2022-10-21 MED ORDER — CHLORHEXIDINE GLUCONATE 0.12 % MT SOLN
OROMUCOSAL | Status: AC
  Administered 2022-10-21: 15 mL via OROMUCOSAL
  Filled 2022-10-21: qty 15

## 2022-10-21 MED ORDER — DEXAMETHASONE SODIUM PHOSPHATE 10 MG/ML IJ SOLN
INTRAMUSCULAR | Status: DC | PRN
  Administered 2022-10-21: 10 mg via INTRAVENOUS

## 2022-10-21 MED ORDER — LISINOPRIL 20 MG PO TABS
40.0000 mg | ORAL_TABLET | Freq: Every day | ORAL | Status: DC
  Administered 2022-10-21 – 2022-10-23 (×3): 40 mg via ORAL
  Filled 2022-10-21 (×3): qty 2

## 2022-10-21 MED ORDER — CLONIDINE HCL 0.2 MG PO TABS
0.2000 mg | ORAL_TABLET | Freq: Two times a day (BID) | ORAL | Status: DC
  Administered 2022-10-21 – 2022-10-23 (×4): 0.2 mg via ORAL
  Filled 2022-10-21 (×5): qty 1

## 2022-10-21 MED ORDER — DEXAMETHASONE SODIUM PHOSPHATE 10 MG/ML IJ SOLN
INTRAMUSCULAR | Status: AC
  Filled 2022-10-21: qty 1

## 2022-10-21 MED ORDER — ACETAMINOPHEN 500 MG PO TABS
1000.0000 mg | ORAL_TABLET | Freq: Four times a day (QID) | ORAL | Status: AC
  Administered 2022-10-21 – 2022-10-22 (×3): 1000 mg via ORAL
  Filled 2022-10-21 (×3): qty 2

## 2022-10-21 MED ORDER — FENTANYL CITRATE (PF) 100 MCG/2ML IJ SOLN
INTRAMUSCULAR | Status: AC
  Administered 2022-10-21: 50 ug via INTRAVENOUS
  Filled 2022-10-21: qty 2

## 2022-10-21 MED ORDER — HYDROMORPHONE HCL 1 MG/ML IJ SOLN: 0.5000 mg | INTRAMUSCULAR | Status: DC | PRN

## 2022-10-21 MED ORDER — CHLORHEXIDINE GLUCONATE CLOTH 2 % EX PADS: 6.0000 | MEDICATED_PAD | Freq: Once | CUTANEOUS | Status: DC

## 2022-10-21 MED ORDER — GABAPENTIN 300 MG PO CAPS
ORAL_CAPSULE | ORAL | Status: AC
  Administered 2022-10-21: 300 mg via ORAL
  Filled 2022-10-21: qty 1

## 2022-10-21 MED ORDER — ONDANSETRON HCL 4 MG/2ML IJ SOLN
INTRAMUSCULAR | Status: AC
  Filled 2022-10-21: qty 2

## 2022-10-21 MED ORDER — OXYCODONE HCL 5 MG PO TABS
ORAL_TABLET | ORAL | Status: AC
  Filled 2022-10-21: qty 1

## 2022-10-21 MED ORDER — CEFAZOLIN SODIUM-DEXTROSE 2-4 GM/100ML-% IV SOLN
2.0000 g | INTRAVENOUS | Status: AC
  Administered 2022-10-21: 2 g via INTRAVENOUS

## 2022-10-21 MED ORDER — INSULIN ASPART 100 UNIT/ML IJ SOLN: 0.0000 [IU] | INTRAMUSCULAR | Status: DC | PRN

## 2022-10-21 MED ORDER — VANCOMYCIN HCL 1000 MG IV SOLR
INTRAVENOUS | Status: AC
  Filled 2022-10-21: qty 20

## 2022-10-21 MED ORDER — ROCURONIUM BROMIDE 10 MG/ML (PF) SYRINGE
PREFILLED_SYRINGE | INTRAVENOUS | Status: AC
  Filled 2022-10-21: qty 10

## 2022-10-21 MED ORDER — METHOCARBAMOL 500 MG PO TABS
ORAL_TABLET | ORAL | Status: AC
  Filled 2022-10-21: qty 1

## 2022-10-21 MED ORDER — KETOROLAC TROMETHAMINE 15 MG/ML IJ SOLN
INTRAMUSCULAR | Status: DC | PRN
  Administered 2022-10-21: 15 mg via INTRAVENOUS

## 2022-10-21 MED ORDER — ORAL CARE MOUTH RINSE: 15.0000 mL | Freq: Once | OROMUCOSAL | Status: DC

## 2022-10-21 MED ORDER — MENTHOL 3 MG MT LOZG: 1.0000 | LOZENGE | OROMUCOSAL | Status: DC | PRN

## 2022-10-21 MED ORDER — THROMBIN 5000 UNITS EX SOLR: OROMUCOSAL | Status: DC | PRN

## 2022-10-21 MED ORDER — PROMETHAZINE HCL 25 MG/ML IJ SOLN: 6.2500 mg | INTRAMUSCULAR | Status: DC | PRN

## 2022-10-21 MED ORDER — ROCURONIUM BROMIDE 10 MG/ML (PF) SYRINGE
PREFILLED_SYRINGE | INTRAVENOUS | Status: DC | PRN
  Administered 2022-10-21: 70 mg via INTRAVENOUS
  Administered 2022-10-21: 30 mg via INTRAVENOUS
  Administered 2022-10-21 (×2): 20 mg via INTRAVENOUS

## 2022-10-21 MED ORDER — LACTATED RINGERS IV SOLN: INTRAVENOUS | Status: DC

## 2022-10-21 MED ORDER — ONDANSETRON HCL 4 MG/2ML IJ SOLN: 4.0000 mg | Freq: Four times a day (QID) | INTRAMUSCULAR | Status: DC | PRN

## 2022-10-21 MED ORDER — MIDAZOLAM HCL 2 MG/2ML IJ SOLN
INTRAMUSCULAR | Status: AC
  Filled 2022-10-21: qty 2

## 2022-10-21 MED ORDER — 0.9 % SODIUM CHLORIDE (POUR BTL) OPTIME
TOPICAL | Status: DC | PRN
  Administered 2022-10-21: 1000 mL

## 2022-10-21 MED ORDER — THROMBIN 5000 UNITS EX SOLR
CUTANEOUS | Status: AC
  Filled 2022-10-21: qty 5000

## 2022-10-21 MED ORDER — BUPIVACAINE HCL (PF) 0.25 % IJ SOLN
INTRAMUSCULAR | Status: DC | PRN
  Administered 2022-10-21: 4 mL

## 2022-10-21 MED ORDER — METHOCARBAMOL 500 MG PO TABS
500.0000 mg | ORAL_TABLET | Freq: Four times a day (QID) | ORAL | Status: DC | PRN
  Administered 2022-10-21 – 2022-10-22 (×2): 500 mg via ORAL
  Filled 2022-10-21 (×2): qty 1

## 2022-10-21 MED ORDER — HYDROMORPHONE HCL 1 MG/ML IJ SOLN
INTRAMUSCULAR | Status: DC | PRN
  Administered 2022-10-21 (×2): .25 mg via INTRAVENOUS

## 2022-10-21 MED ORDER — PHENOL 1.4 % MT LIQD: 1.0000 | OROMUCOSAL | Status: DC | PRN

## 2022-10-21 MED ORDER — TIMOLOL MALEATE 0.5 % OP SOLN
1.0000 [drp] | Freq: Every day | OPHTHALMIC | Status: DC
  Administered 2022-10-22 – 2022-10-23 (×2): 1 [drp] via OPHTHALMIC
  Filled 2022-10-21: qty 5

## 2022-10-21 MED ORDER — SODIUM CHLORIDE 0.9% FLUSH: 3.0000 mL | Freq: Two times a day (BID) | INTRAVENOUS | Status: DC

## 2022-10-21 MED ORDER — PROPOFOL 10 MG/ML IV BOLUS
INTRAVENOUS | Status: AC
  Filled 2022-10-21: qty 20

## 2022-10-21 MED ORDER — VANCOMYCIN HCL 1000 MG IV SOLR
INTRAVENOUS | Status: DC | PRN
  Administered 2022-10-21: 1000 mg

## 2022-10-21 MED ORDER — ORAL CARE MOUTH RINSE: 15.0000 mL | Freq: Once | OROMUCOSAL | Status: AC

## 2022-10-21 MED ORDER — ADULT MULTIVITAMIN W/MINERALS CH
1.0000 | ORAL_TABLET | Freq: Every day | ORAL | Status: DC
  Administered 2022-10-21 – 2022-10-23 (×3): 1 via ORAL
  Filled 2022-10-21 (×3): qty 1

## 2022-10-21 MED ORDER — CEFAZOLIN SODIUM-DEXTROSE 2-4 GM/100ML-% IV SOLN
INTRAVENOUS | Status: AC
  Filled 2022-10-21: qty 100

## 2022-10-21 MED ORDER — METHOCARBAMOL 1000 MG/10ML IJ SOLN: 500.0000 mg | Freq: Four times a day (QID) | INTRAVENOUS | Status: DC | PRN

## 2022-10-21 MED ORDER — OXYCODONE HCL 5 MG PO TABS
5.0000 mg | ORAL_TABLET | ORAL | Status: DC | PRN
  Administered 2022-10-21 – 2022-10-23 (×8): 5 mg via ORAL
  Filled 2022-10-21 (×8): qty 1

## 2022-10-21 MED ORDER — CHLORHEXIDINE GLUCONATE 0.12 % MT SOLN
15.0000 mL | Freq: Once | OROMUCOSAL | Status: DC
  Administered 2022-10-21: 15 mL via OROMUCOSAL

## 2022-10-21 MED ORDER — INSULIN ASPART 100 UNIT/ML IJ SOLN
0.0000 [IU] | Freq: Three times a day (TID) | INTRAMUSCULAR | Status: DC
  Administered 2022-10-22 – 2022-10-23 (×4): 2 [IU] via SUBCUTANEOUS
  Administered 2022-10-23: 3 [IU] via SUBCUTANEOUS

## 2022-10-21 MED ORDER — HYDROCHLOROTHIAZIDE 25 MG PO TABS
25.0000 mg | ORAL_TABLET | Freq: Every day | ORAL | Status: DC
  Administered 2022-10-21 – 2022-10-23 (×3): 25 mg via ORAL
  Filled 2022-10-21 (×3): qty 1

## 2022-10-21 MED ORDER — ACETAMINOPHEN 500 MG PO TABS: 1000.0000 mg | ORAL_TABLET | Freq: Once | ORAL | Status: AC

## 2022-10-21 MED ORDER — FENTANYL CITRATE (PF) 250 MCG/5ML IJ SOLN
INTRAMUSCULAR | Status: AC
  Filled 2022-10-21: qty 5

## 2022-10-21 MED ORDER — CELECOXIB 200 MG PO CAPS
200.0000 mg | ORAL_CAPSULE | Freq: Two times a day (BID) | ORAL | Status: DC
  Administered 2022-10-21 – 2022-10-23 (×4): 200 mg via ORAL
  Filled 2022-10-21 (×4): qty 1

## 2022-10-21 MED ORDER — HYDROMORPHONE HCL 1 MG/ML IJ SOLN
INTRAMUSCULAR | Status: AC
  Filled 2022-10-21: qty 0.5

## 2022-10-21 MED ORDER — GABAPENTIN 300 MG PO CAPS: 300.0000 mg | ORAL_CAPSULE | ORAL | Status: AC

## 2022-10-21 MED ORDER — INSULIN ASPART 100 UNIT/ML IJ SOLN: 0.0000 [IU] | Freq: Three times a day (TID) | INTRAMUSCULAR | Status: DC

## 2022-10-21 MED ORDER — EPHEDRINE SULFATE-NACL 50-0.9 MG/10ML-% IV SOSY
PREFILLED_SYRINGE | INTRAVENOUS | Status: DC | PRN
  Administered 2022-10-21 (×2): 2.5 mg via INTRAVENOUS

## 2022-10-21 MED ORDER — CEFAZOLIN SODIUM-DEXTROSE 2-4 GM/100ML-% IV SOLN
2.0000 g | Freq: Three times a day (TID) | INTRAVENOUS | Status: AC
  Administered 2022-10-21 – 2022-10-22 (×2): 2 g via INTRAVENOUS
  Filled 2022-10-21 (×2): qty 100

## 2022-10-21 MED ORDER — POTASSIUM CHLORIDE IN NACL 20-0.9 MEQ/L-% IV SOLN: INTRAVENOUS | Status: DC

## 2022-10-21 MED ORDER — METFORMIN HCL ER 500 MG PO TB24
500.0000 mg | ORAL_TABLET | Freq: Every day | ORAL | Status: DC
  Administered 2022-10-22 – 2022-10-23 (×2): 500 mg via ORAL
  Filled 2022-10-21 (×2): qty 1

## 2022-10-21 MED ORDER — FENTANYL CITRATE (PF) 100 MCG/2ML IJ SOLN
25.0000 ug | INTRAMUSCULAR | Status: DC | PRN
  Administered 2022-10-21: 25 ug via INTRAVENOUS

## 2022-10-21 MED ORDER — SENNA 8.6 MG PO TABS
1.0000 | ORAL_TABLET | Freq: Two times a day (BID) | ORAL | Status: DC
  Administered 2022-10-21 – 2022-10-23 (×4): 8.6 mg via ORAL
  Filled 2022-10-21 (×4): qty 1

## 2022-10-21 MED ORDER — BUPIVACAINE HCL (PF) 0.25 % IJ SOLN
INTRAMUSCULAR | Status: AC
  Filled 2022-10-21: qty 30

## 2022-10-21 MED ORDER — CHLORHEXIDINE GLUCONATE 0.12 % MT SOLN: 15.0000 mL | Freq: Once | OROMUCOSAL | Status: AC

## 2022-10-21 MED ORDER — FENTANYL CITRATE (PF) 250 MCG/5ML IJ SOLN
INTRAMUSCULAR | Status: DC | PRN
  Administered 2022-10-21 (×2): 50 ug via INTRAVENOUS
  Administered 2022-10-21: 100 ug via INTRAVENOUS
  Administered 2022-10-21: 50 ug via INTRAVENOUS

## 2022-10-21 MED ORDER — THROMBIN 20000 UNITS EX SOLR: CUTANEOUS | Status: DC | PRN

## 2022-10-21 MED ORDER — SODIUM CHLORIDE 0.9% FLUSH: 3.0000 mL | INTRAVENOUS | Status: DC | PRN

## 2022-10-21 MED ORDER — SUGAMMADEX SODIUM 200 MG/2ML IV SOLN
INTRAVENOUS | Status: DC | PRN
  Administered 2022-10-21: 50 mg via INTRAVENOUS
  Administered 2022-10-21: 150 mg via INTRAVENOUS

## 2022-10-21 MED ORDER — AMISULPRIDE (ANTIEMETIC) 5 MG/2ML IV SOLN: 10.0000 mg | Freq: Once | INTRAVENOUS | Status: DC | PRN

## 2022-10-21 MED ORDER — SCOPOLAMINE 1 MG/3DAYS TD PT72: 1.0000 | MEDICATED_PATCH | TRANSDERMAL | Status: DC

## 2022-10-21 MED ORDER — ACETAMINOPHEN 500 MG PO TABS: 1000.0000 mg | ORAL_TABLET | ORAL | Status: DC

## 2022-10-21 MED ORDER — LIDOCAINE 2% (20 MG/ML) 5 ML SYRINGE
INTRAMUSCULAR | Status: AC
  Filled 2022-10-21: qty 5

## 2022-10-21 MED ORDER — ONDANSETRON HCL 4 MG PO TABS: 4.0000 mg | ORAL_TABLET | Freq: Four times a day (QID) | ORAL | Status: DC | PRN

## 2022-10-21 MED ORDER — INSULIN ASPART 100 UNIT/ML IJ SOLN
0.0000 [IU] | Freq: Every day | INTRAMUSCULAR | Status: DC
  Administered 2022-10-21: 2 [IU] via SUBCUTANEOUS

## 2022-10-21 MED ORDER — PROPOFOL 10 MG/ML IV BOLUS
INTRAVENOUS | Status: DC | PRN
  Administered 2022-10-21: 30 mg via INTRAVENOUS
  Administered 2022-10-21: 140 mg via INTRAVENOUS

## 2022-10-21 MED ORDER — LIDOCAINE 2% (20 MG/ML) 5 ML SYRINGE
INTRAMUSCULAR | Status: DC | PRN
  Administered 2022-10-21: 100 mg via INTRAVENOUS

## 2022-10-21 MED ORDER — SODIUM CHLORIDE 0.9 % IV SOLN
250.0000 mL | INTRAVENOUS | Status: DC
  Administered 2022-10-21: 250 mL via INTRAVENOUS

## 2022-10-21 SURGICAL SUPPLY — 69 items
ADH SKN CLS APL DERMABOND .7 (GAUZE/BANDAGES/DRESSINGS) ×1
APL SKNCLS STERI-STRIP NONHPOA (GAUZE/BANDAGES/DRESSINGS) ×1
BAG COUNTER SPONGE SURGICOUNT (BAG) ×2 IMPLANT
BAG SPNG CNTER NS LX DISP (BAG) ×1
BASKET BONE COLLECTION (BASKET) ×2 IMPLANT
BENZOIN TINCTURE PRP APPL 2/3 (GAUZE/BANDAGES/DRESSINGS) ×2 IMPLANT
BLADE BONE MILL MEDIUM (MISCELLANEOUS) ×2 IMPLANT
BLADE CLIPPER SURG (BLADE) IMPLANT
BONE FIBERS PLIAFX 10 (Bone Implant) ×1 IMPLANT
BUR CARBIDE MATCH 3.0 (BURR) ×2 IMPLANT
CANISTER SUCT 3000ML PPV (MISCELLANEOUS) ×2 IMPLANT
CNTNR URN SCR LID CUP LEK RST (MISCELLANEOUS) ×2 IMPLANT
CONT SPEC 4OZ STRL OR WHT (MISCELLANEOUS) ×1
COVER BACK TABLE 60X90IN (DRAPES) ×2 IMPLANT
DERMABOND ADVANCED .7 DNX12 (GAUZE/BANDAGES/DRESSINGS) ×2 IMPLANT
DRAPE C-ARM 42X72 X-RAY (DRAPES) ×4 IMPLANT
DRAPE C-ARMOR (DRAPES) ×2 IMPLANT
DRAPE LAPAROTOMY 100X72X124 (DRAPES) ×2 IMPLANT
DRAPE SURG 17X23 STRL (DRAPES) ×2 IMPLANT
DRSG OPSITE POSTOP 4X6 (GAUZE/BANDAGES/DRESSINGS) IMPLANT
DURAPREP 26ML APPLICATOR (WOUND CARE) ×2 IMPLANT
ELECT BLADE 4.0 EZ CLEAN MEGAD (MISCELLANEOUS) ×1
ELECT REM PT RETURN 9FT ADLT (ELECTROSURGICAL) ×1
ELECTRODE BLDE 4.0 EZ CLN MEGD (MISCELLANEOUS) IMPLANT
ELECTRODE REM PT RTRN 9FT ADLT (ELECTROSURGICAL) ×2 IMPLANT
EVACUATOR 1/8 PVC DRAIN (DRAIN) ×2 IMPLANT
GAUZE 4X4 16PLY ~~LOC~~+RFID DBL (SPONGE) IMPLANT
GLOVE BIO SURGEON STRL SZ7 (GLOVE) IMPLANT
GLOVE BIO SURGEON STRL SZ8 (GLOVE) ×4 IMPLANT
GLOVE BIOGEL PI IND STRL 7.0 (GLOVE) IMPLANT
GOWN STRL REUS W/ TWL LRG LVL3 (GOWN DISPOSABLE) IMPLANT
GOWN STRL REUS W/ TWL XL LVL3 (GOWN DISPOSABLE) ×4 IMPLANT
GOWN STRL REUS W/TWL 2XL LVL3 (GOWN DISPOSABLE) IMPLANT
GOWN STRL REUS W/TWL LRG LVL3 (GOWN DISPOSABLE)
GOWN STRL REUS W/TWL XL LVL3 (GOWN DISPOSABLE) ×2
GRAFT BNE FBR PLIAFX PRIME 10 (Bone Implant) IMPLANT
GRAFT BONE PROTEIOS XL 10CC (Orthopedic Implant) IMPLANT
HEMOSTAT POWDER KIT SURGIFOAM (HEMOSTASIS) ×2 IMPLANT
KIT BASIN OR (CUSTOM PROCEDURE TRAY) ×2 IMPLANT
KIT GRAFTMAG DEL NEURO DISP (NEUROSURGERY SUPPLIES) IMPLANT
KIT TURNOVER KIT B (KITS) ×2 IMPLANT
MILL BONE PREP (MISCELLANEOUS) ×2 IMPLANT
NDL HYPO 25X1 1.5 SAFETY (NEEDLE) ×2 IMPLANT
NEEDLE HYPO 25X1 1.5 SAFETY (NEEDLE) ×1 IMPLANT
NS IRRIG 1000ML POUR BTL (IV SOLUTION) ×2 IMPLANT
PACK LAMINECTOMY NEURO (CUSTOM PROCEDURE TRAY) ×2 IMPLANT
PAD ARMBOARD 7.5X6 YLW CONV (MISCELLANEOUS) ×6 IMPLANT
ROD LORD LIPPED TI 5.5X60 (Rod) IMPLANT
SCREW CANC SHANK MOD 6.5X45 (Screw) IMPLANT
SCREW KODIAK 6.5X40 (Screw) IMPLANT
SCREW POLYAXIAL TULIP (Screw) IMPLANT
SEALANT ADHERUS EXTEND TIP (MISCELLANEOUS) IMPLANT
SET SCREW (Screw) ×6 IMPLANT
SET SCREW SPNE (Screw) IMPLANT
SPACER IDENTITI 11X9X25 5D (Spacer) IMPLANT
SPACER IDENTITI PS 11X9X25 5D (Spacer) IMPLANT
SPONGE SURGIFOAM ABS GEL 100 (HEMOSTASIS) ×2 IMPLANT
SPONGE T-LAP 4X18 ~~LOC~~+RFID (SPONGE) IMPLANT
STRIP CLOSURE SKIN 1/2X4 (GAUZE/BANDAGES/DRESSINGS) ×4 IMPLANT
SUT PROLENE 6 0 BV (SUTURE) IMPLANT
SUT VIC AB 0 CT1 18XCR BRD8 (SUTURE) ×2 IMPLANT
SUT VIC AB 0 CT1 8-18 (SUTURE) ×2
SUT VIC AB 2-0 CP2 18 (SUTURE) ×2 IMPLANT
SUT VIC AB 3-0 SH 8-18 (SUTURE) ×4 IMPLANT
SYR CONTROL 10ML LL (SYRINGE) ×2 IMPLANT
TOWEL GREEN STERILE (TOWEL DISPOSABLE) ×2 IMPLANT
TOWEL GREEN STERILE FF (TOWEL DISPOSABLE) ×2 IMPLANT
TRAY FOLEY MTR SLVR 16FR STAT (SET/KITS/TRAYS/PACK) ×2 IMPLANT
WATER STERILE IRR 1000ML POUR (IV SOLUTION) ×2 IMPLANT

## 2022-10-21 NOTE — Anesthesia Procedure Notes (Signed)
Procedure Name: Intubation Date/Time: 10/21/2022 11:14 AM  Performed by: Rande Brunt, CRNAPre-anesthesia Checklist: Patient identified, Emergency Drugs available, Suction available and Patient being monitored Patient Re-evaluated:Patient Re-evaluated prior to induction Oxygen Delivery Method: Circle System Utilized Preoxygenation: Pre-oxygenation with 100% oxygen Induction Type: IV induction Ventilation: Mask ventilation without difficulty Laryngoscope Size: Mac and 3 Grade View: Grade I Tube type: Oral Tube size: 7.0 mm Number of attempts: 1 Airway Equipment and Method: Stylet and Oral airway Placement Confirmation: ETT inserted through vocal cords under direct vision, positive ETCO2 and breath sounds checked- equal and bilateral Secured at: 22 cm Tube secured with: Tape Dental Injury: Teeth and Oropharynx as per pre-operative assessment

## 2022-10-21 NOTE — Op Note (Signed)
10/21/2022  3:57 PM  PATIENT:  Kristen Alvarado  67 y.o. female  PRE-OPERATIVE DIAGNOSIS: Spondylolisthesis L4-5 with severe spinal stenosis, very large disc herniation L5-S1 with a large superior free fragment causing severe spinal stenosis, back pain and left radiculopathy  POST-OPERATIVE DIAGNOSIS:  same  PROCEDURE:   1. Decompressive lumbar laminectomy, hemi facetectomy and foraminotomies L4-5 L5-S1 requiring more work than would be required for a simple exposure of the disk for PLIF in order to adequately decompress the neural elements and address the spinal stenosis 2. Posterior lumbar interbody fusion L4-5 L5-S1 using PTI interbody cages packed with morcellized allograft and autograft  3. Posterior fixation L4-S1 inclusive using ATEC cortical pedicle screws.  4. Intertransverse arthrodesis L4-S1 using morcellized autograft and allograft.  SURGEON:  Sherley Bounds, MD  ASSISTANTS: Glenford Peers, FNP  ANESTHESIA:  General  EBL: 250 ml  Total I/O In: 1100 [I.V.:1000; IV Piggyback:100] Out: 450 [Urine:200; Blood:250]  BLOOD ADMINISTERED:none  DRAINS: none   INDICATION FOR PROCEDURE: This patient presented with back pain with left leg pain. Imaging revealed large disc herniation L5-S1 in the midline with a large superior fragment causing severe spinal stenosis, with severe spinal stenosis and a grade 1-2 spondylolisthesis at L4-5. The patient tried a reasonable attempt at conservative medical measures without relief. I recommended decompression and instrumented fusion to address the stenosis as well as the segmental  instability.  Patient understood the risks, benefits, and alternatives and potential outcomes and wished to proceed.  PROCEDURE DETAILS:  The patient was brought to the operating room. After induction of generalized endotracheal anesthesia the patient was rolled into the prone position on chest rolls and all pressure points were padded. The patient's lumbar region was  cleaned and then prepped with DuraPrep and draped in the usual sterile fashion. Anesthesia was injected and then a dorsal midline incision was made and carried down to the lumbosacral fascia. The fascia was opened and the paraspinous musculature was taken down in a subperiosteal fashion to expose L4-5 and L5-S1. A self-retaining retractor was placed. Intraoperative fluoroscopy confirmed my level, and I started with placement of the L4 cortical pedicle screws. The pedicle screw entry zones were identified utilizing surface landmarks and  AP and lateral fluoroscopy. I scored the cortex with the high-speed drill and then used the hand drill to drill an upward and outward direction into the pedicle. I then tapped line to line. I then placed a 6.5 x 45 mm cortical pedicle screw into the pedicles of L4 bilaterally.    I then turned my attention to the decompression and complete lumbar laminectomies, hemi- facetectomies, and foraminotomies were performed at L4-5 and L5-S1.  My nurse practitioner was directly involved in the decompression and exposure of the neural elements. the patient had significant spinal stenosis and this required more work than would be required for a simple exposure of the disc for posterior lumbar interbody fusion which would only require a limited laminotomy. Much more generous decompression and generous foraminotomy was undertaken in order to adequately decompress the neural elements and address the patient's leg pain. The yellow ligament was removed to expose the underlying dura and nerve roots, and generous foraminotomies were performed to adequately decompress the neural elements. Both the exiting and traversing nerve roots were decompressed on both sides until a coronary dilator passed easily along the nerve roots. Once the decompression was complete, I turned my attention to the posterior lower lumbar interbody fusion. The epidural venous vasculature was coagulated and cut sharply. Disc  space was incised and the initial discectomy was performed with pituitary rongeurs. The disc space was distracted with sequential distractors to a height of 11 mm. We then used a series of scrapers and shavers to prepare the endplates for fusion. The midline was prepared with Epstein curettes. Once the complete discectomy was finished, we packed an appropriate sized interbody cage with local autograft and morcellized allograft, gently retracted the nerve root, and tapped the cage into position at L4-5 and L5-S1.  The midline between the cages was packed with morselized autograft and allograft.   Unfortunately while removing one of the trials, there was a unintended durotomy along the left anterior lateral dura at L5-S1.  We removed the spinous process of L5 and then we were able to retract the dura and closed this with interrupted 6-0 Prolene sutures.  We then checked a Valsalva up to 40 and saw no evidence of CSF leak.  We then turned our attention to the placement of the lower pedicle screws. The pedicle screw entry zones were identified utilizing surface landmarks and fluoroscopy. I drilled into each pedicle utilizing the hand drill, and tapped each pedicle with the appropriate tap. We palpated with a ball probe to assure no break in the cortex. We then placed 6.5 x 40 mm pedicle screws into the pedicles bilaterally at L5 and S1 bilaterally.  My nurse practitioner assisted in placement of the pedicle screws.  We then decorticated the transverse processes and laid a mixture of morcellized autograft and allograft out over these to perform intertransverse arthrodesis at L4-S1. We then placed lordotic rods into the multiaxial screw heads of the pedicle screws and locked these in position with the locking caps and anti-torque device while achieving compression of her grafts. We then checked our construct with AP and lateral fluoroscopy. Irrigated with copious amounts of bacitracin-containing saline solution.  Inspected the nerve roots once again to assure adequate decompression, lined the durotomy repair with muscle and Tisseel fibrin glue, lined to the dura with Gelfoam,  and then we closed the muscle and the fascia with 0 Vicryl. Closed the subcutaneous tissues with 2-0 Vicryl and subcuticular tissues with 3-0 Vicryl. The skin was closed with benzoin and Steri-Strips. Dressing was then applied, the patient was awakened from general anesthesia and transported to the recovery room in stable condition. At the end of the procedure all sponge, needle and instrument counts were correct.   PLAN OF CARE: admit to inpatient  PATIENT DISPOSITION:  PACU - hemodynamically stable.   Delay start of Pharmacological VTE agent (>24hrs) due to surgical blood loss or risk of bleeding:  yes

## 2022-10-21 NOTE — Transfer of Care (Signed)
Immediate Anesthesia Transfer of Care Note  Patient: Kristen Alvarado  Procedure(s) Performed: POSTERIOR LUMBAR INTERBODY FUSION LUMBAR FOUR THROUGH LUMBAR FIVE LUMBAR FIVE THROUGH SACRAL ONE POSTERIOR LATERAL AND INTERBODY FUSION (Spine Lumbar)  Patient Location: PACU  Anesthesia Type:General  Level of Consciousness: awake, drowsy, and patient cooperative  Airway & Oxygen Therapy: Patient Spontanous Breathing  Post-op Assessment: Report given to RN, Post -op Vital signs reviewed and stable, and Patient moving all extremities X 4  Post vital signs: Reviewed and stable  Last Vitals:  Vitals Value Taken Time  BP 204/95 10/21/22 1630  Temp 36.6 C 10/21/22 1614  Pulse 65 10/21/22 1634  Resp 10 10/21/22 1634  SpO2 97 % 10/21/22 1634  Vitals shown include unvalidated device data.  Last Pain:  Vitals:   10/21/22 1625  TempSrc:   PainSc: 9       Patients Stated Pain Goal: 3 ([id number])  Complications: No notable events documented.

## 2022-10-21 NOTE — H&P (Signed)
Subjective: Patient is a 67 y.o. female admitted for PLIF. Onset of symptoms was several months ago, gradually worsening since that time.  The pain is rated severe, and is located at the across the lower back and radiates to LLE. The pain is described as aching and stabbing and occurs all day. The symptoms have been progressive. Symptoms are exacerbated by nothing in particular. MRI or CT showed HNP L5-S1, spondylolisthesis L4-5   Past Medical History:  Diagnosis Date   Diabetes mellitus without complication ([place])    type 2   Hypertension     Past Surgical History:  Procedure Laterality Date   ABDOMINAL HYSTERECTOMY     APPENDECTOMY     TUBAL LIGATION      Prior to Admission medications   Medication Sig Start Date End Date Taking? Authorizing Provider  atorvastatin (LIPITOR) 20 MG tablet Take 20 mg by mouth daily. 09/14/21  Yes [provider]  cholecalciferol (VITAMIN D3) 25 MCG (1000 UNIT) tablet Take 1,000 Units by mouth daily.   Yes [provider]  cloNIDine (CATAPRES) 0.2 MG tablet Take 0.2 mg by mouth 2 (two) times daily. 09/12/21  Yes [provider]  hydrochlorothiazide (HYDRODIURIL) 25 MG tablet Take 25 mg by mouth daily.   Yes [provider]  HYDROcodone-acetaminophen (NORCO/VICODIN) 5-325 MG tablet Take 1 tablet by mouth every 6 (six) hours as needed for moderate pain. 09/07/22  Yes [provider]  lisinopril (ZESTRIL) 40 MG tablet Take 40 mg by mouth daily.   Yes [provider]  metFORMIN (GLUCOPHAGE-XR) 500 MG 24 hr tablet Take 500 mg by mouth daily with breakfast. 09/17/21  Yes [provider]  Multiple Vitamin (MULTIVITAMIN WITH MINERALS) TABS tablet Take 1 tablet by mouth daily.   Yes [provider]  timolol (TIMOPTIC) 0.5 % ophthalmic solution Place 1 drop into both eyes daily. 10/03/22  Yes [provider]   No Known Allergies  Social History   Tobacco Use   Smoking status: Never    Smokeless tobacco: Never  Substance Use Topics   Alcohol use: No    History reviewed. No pertinent family history.   Review of Systems  Positive ROS: neg  All other systems have been reviewed and were otherwise negative with the exception of those mentioned in the HPI and as above.  Objective: Vital signs in last 24 hours: Temp:  [98 F (36.7 C)] 98 F (36.7 C) (02/12 0851) Pulse Rate:  [61] 61 (02/12 0851) Resp:  [18] 18 (02/12 0851) BP: (195)/(90) 195/90 (02/12 0851) SpO2:  [98 %] 98 % (02/12 0851) Weight:  [108.9 kg] 108.9 kg (02/12 0926)  General Appearance: Alert, cooperative, no distress, appears stated age Head: Normocephalic, without obvious abnormality, atraumatic Eyes: PERRL, conjunctiva/corneas clear, EOM's intact    Neck: Supple, symmetrical, trachea midline Back: Symmetric, no curvature, ROM normal, no CVA tenderness Lungs:  respirations unlabored Heart: Regular rate and rhythm Abdomen: Soft, non-tender Extremities: Extremities normal, atraumatic, no cyanosis or edema Pulses: 2+ and symmetric all extremities Skin: Skin color, texture, turgor normal, no rashes or lesions  NEUROLOGIC:   Mental status: Alert and oriented x4,  no aphasia, good attention span, fund of knowledge, and memory Motor Exam - grossly normal Sensory Exam - grossly normal Reflexes: 1= Coordination - grossly normal Gait - grossly normal Balance - grossly normal Cranial Nerves: I: smell Not tested  II: visual acuity  OS: nl    OD: nl  II: visual fields Full to confrontation  II:  pupils Equal, round, reactive to light  III,VII: ptosis None  III,IV,VI: extraocular muscles  Full ROM  V: mastication Normal  V: facial light touch sensation  Normal  V,VII: corneal reflex  Present  VII: facial muscle function - upper  Normal  VII: facial muscle function - lower Normal  VIII: hearing Not tested  IX: soft palate elevation  Normal  IX,X: gag reflex Present  XI: trapezius strength  5/5   XI: sternocleidomastoid strength 5/5  XI: neck flexion strength  5/5  XII: tongue strength  Normal    Data Review Lab Results  Component Value Date   WBC 5.5 10/10/2022   HGB 11.8 (L) 10/10/2022   HCT 36.1 10/10/2022   MCV 81.7 10/10/2022   PLT 282 10/10/2022   Lab Results  Component Value Date   NA 140 10/10/2022   K 3.4 (L) 10/10/2022   CL 103 10/10/2022   CO2 30 10/10/2022   BUN 11 10/10/2022   CREATININE 0.88 10/10/2022   GLUCOSE 146 (H) 10/10/2022   Lab Results  Component Value Date   INR 1.0 10/10/2022    Assessment/Plan:  Estimated body mass index is 38.74 kg/m as calculated from the following:   Height as of this encounter: 5' 6"$  (1.676 m).   Weight as of this encounter: 108.9 kg. Patient admitted for PLIF L4-5 l5-S1. Patient has failed a reasonable attempt at conservative therapy.  I explained the condition and procedure to the patient and answered any questions.  Patient wishes to proceed with procedure as planned. Understands risks/ benefits and typical outcomes of procedure.   Eustace Moore 10/21/2022 10:58 AM

## 2022-10-22 DIAGNOSIS — M5117 Intervertebral disc disorders with radiculopathy, lumbosacral region: Secondary | ICD-10-CM | POA: Diagnosis not present

## 2022-10-22 DIAGNOSIS — Z79899 Other long term (current) drug therapy: Secondary | ICD-10-CM | POA: Diagnosis not present

## 2022-10-22 DIAGNOSIS — M4807 Spinal stenosis, lumbosacral region: Secondary | ICD-10-CM | POA: Diagnosis not present

## 2022-10-22 DIAGNOSIS — R2681 Unsteadiness on feet: Secondary | ICD-10-CM | POA: Diagnosis not present

## 2022-10-22 DIAGNOSIS — I10 Essential (primary) hypertension: Secondary | ICD-10-CM | POA: Diagnosis not present

## 2022-10-22 DIAGNOSIS — E119 Type 2 diabetes mellitus without complications: Secondary | ICD-10-CM | POA: Diagnosis not present

## 2022-10-22 DIAGNOSIS — Z7984 Long term (current) use of oral hypoglycemic drugs: Secondary | ICD-10-CM | POA: Diagnosis not present

## 2022-10-22 DIAGNOSIS — M4316 Spondylolisthesis, lumbar region: Secondary | ICD-10-CM | POA: Diagnosis not present

## 2022-10-22 LAB — GLUCOSE, CAPILLARY
Glucose-Capillary: 121 mg/dL — ABNORMAL HIGH (ref 70–99)
Glucose-Capillary: 142 mg/dL — ABNORMAL HIGH (ref 70–99)
Glucose-Capillary: 135 mg/dL — ABNORMAL HIGH (ref 70–99)
Glucose-Capillary: 126 mg/dL — ABNORMAL HIGH (ref 70–99)

## 2022-10-22 NOTE — TOC Initial Note (Addendum)
Transition of Care [facility] [place]) - Initial/Assessment Note    Patient Details  Name: Kristen Alvarado MRN: [id number] Date of Birth: 1955/12/02  Transition of Care [facility]) CM/SW Contact:    Pollie Friar, RN Phone Number: 10/22/2022, 2:50 PM  Clinical Narrative:                 Pt is from home alone. Per daughter the home needs some cleaning up prior to pt returning home. She is in the process of working on this.  Pt and daughter would like her to attend SNF rehab prior to home. Pt ambulated well with therapy and CM updated them that [facility] may not approve a rehab stay. They voiced understanding but want to proceed with seeing if they will approve. Pt faxed out and provided bed offers. Pt selected [facility] has accepted and will have a bed tomorrow. CM has asked TOC MOA to begin insurance auth with [place].  They are open to [facility] services if denied for SNF rehab.  Daughter: Kristen Alvarado is asking to be called with [facility] decision and plans: [phone] [facility] following.  1525Josem Kaufmann ID [id number]   Expected Discharge Plan: Skilled Nursing Facility Barriers to Discharge: Continued Medical Work up   Patient Goals and CMS Choice   CMS Medicare.gov Compare Post Acute Care list provided to:: Patient Choice offered to / list presented to : Patient, Adult Children      Expected Discharge Plan and Services In-house Referral: Clinical Social Work Discharge Planning Services: CM Consult Post Acute Care Choice: [place] Living arrangements for the past 2 months: [place]                                      Prior Living Arrangements/Services Living arrangements for the past 2 months: Single Family Home Lives with:: Self Patient language and need for interpreter reviewed:: Yes Do you feel safe going back to the place where you live?: Yes        Care giver support system in place?: No (comment)   Criminal Activity/Legal Involvement Pertinent to Current  Situation/Hospitalization: No - Comment as needed  Activities of Daily Living      Permission Sought/Granted                  Emotional Assessment Appearance:: Appears stated age Attitude/Demeanor/Rapport: Engaged Affect (typically observed): Accepting Orientation: : Oriented to Self, Oriented to Place, Oriented to  Time, Oriented to Situation   Psych Involvement: No (comment)  Admission diagnosis:  S/P lumbar fusion [Z98.1] Patient Active Problem List   Diagnosis Date Noted   S/P lumbar fusion 10/21/2022   PCP:  Kristen Loader, FNP Pharmacy:   [facility] ([place]), [place] [id number] [place] ([place]) [place] [address] Phone: [phone] Fax: [phone]     Social Determinants of Health (SDOH) Social History: SDOH Screenings   Tobacco Use: Low Risk  (10/21/2022)   SDOH Interventions:     Readmission Risk Interventions     No data to display

## 2022-10-22 NOTE — NC FL2 (Signed)
[place] LEVEL OF CARE FORM     IDENTIFICATION  Patient Name: Kristen Alvarado Birthdate: 09-Oct-1955 Sex: female Admission Date (Current Location): 10/21/2022  [facility] and [place] Number:  Herbalist and Address:  The . [facility], [place] [address] [place], [place] [address]      Provider Number: [id number]  Attending Physician Name and Address:  Eustace Moore, MD  Relative Name and Phone Number:       Current Level of Care: Hospital Recommended Level of Care: [place] Prior Approval Number:    Date Approved/Denied:   PASRR Number:    Discharge Plan: SNF    Current Diagnoses: Patient Active Problem List   Diagnosis Date Noted   S/P lumbar fusion 10/21/2022    Orientation RESPIRATION BLADDER Height & Weight     Self, Time, Situation, Place  Normal Continent Weight: 108.9 kg Height:  5' 6"$  (167.6 cm)  BEHAVIORAL SYMPTOMS/MOOD NEUROLOGICAL BOWEL NUTRITION STATUS      Continent Diet (Carb modified with thin liquids)  AMBULATORY STATUS COMMUNICATION OF NEEDS Skin   Limited Assist Verbally Surgical wounds (back dressing)                       Personal Care Assistance Level of Assistance  Bathing, Feeding, Dressing Bathing Assistance: Limited assistance Feeding assistance: Independent Dressing Assistance: Limited assistance     Functional Limitations Info  Sight, Hearing, Speech Sight Info: Adequate Hearing Info: Adequate Speech Info: Adequate    SPECIAL CARE FACTORS FREQUENCY  PT (By licensed PT), OT (By licensed OT)     PT Frequency: 5x/wk OT Frequency: 5x/wk            Contractures Contractures Info: Not present    Additional Factors Info  Code Status, Allergies, Psychotropic, Insulin Sliding Scale Code Status Info: Full Allergies Info: NKA Psychotropic Info: Celebrex 200 mg twice a day Insulin Sliding Scale Info: Novolog 0-15 units SQ three times a day/ Novolog 0-5 units SQ at  bedtime       Current Medications (10/22/2022):  This is the current hospital active medication list Current Facility-Administered Medications  Medication Dose Route Frequency Provider Last Rate Last Admin   0.9 %  sodium chloride infusion  250 mL Intravenous Continuous Eustace Moore, MD 1 mL/hr at 10/21/22 1815 250 mL at 10/21/22 1815   0.9 % NaCl with KCl 20 mEq/ L  infusion   Intravenous Continuous Eustace Moore, MD       acetaminophen (TYLENOL) tablet 1,000 mg  1,000 mg Oral Q6H Eustace Moore, MD   1,000 mg at 10/22/22 [id number]   celecoxib (CELEBREX) capsule 200 mg  200 mg Oral Q12H Eustace Moore, MD   200 mg at 10/22/22 0915   cloNIDine (CATAPRES) tablet 0.2 mg  0.2 mg Oral BID Eustace Moore, MD   0.2 mg at 10/22/22 0915   hydrochlorothiazide (HYDRODIURIL) tablet 25 mg  25 mg Oral Daily Eustace Moore, MD   25 mg at 10/22/22 0915   HYDROmorphone (DILAUDID) injection 0.5 mg  0.5 mg Intravenous Q2H PRN Eustace Moore, MD       insulin aspart (novoLOG) injection 0-15 Units  0-15 Units Subcutaneous TID WC Eustace Moore, MD   2 Units at 10/22/22 0636   insulin aspart (novoLOG) injection 0-5 Units  0-5 Units Subcutaneous QHS Eustace Moore, MD   2 Units at 10/21/22 2148   lisinopril (ZESTRIL)  tablet 40 mg  40 mg Oral Daily Eustace Moore, MD   40 mg at 10/22/22 0915   menthol-cetylpyridinium (CEPACOL) lozenge 3 mg  1 lozenge Oral PRN Eustace Moore, MD       Or   phenol [facility]) mouth spray 1 spray  1 spray Mouth/Throat PRN Eustace Moore, MD       metFORMIN (GLUCOPHAGE-XR) 24 hr tablet 500 mg  500 mg Oral Q breakfast Eustace Moore, MD   500 mg at 10/22/22 0915   methocarbamol (ROBAXIN) tablet 500 mg  500 mg Oral Q6H PRN Eustace Moore, MD   500 mg at 10/22/22 [id number]   Or   methocarbamol (ROBAXIN) 500 mg in dextrose 5 % 50 mL IVPB  500 mg Intravenous Q6H PRN Eustace Moore, MD       multivitamin with minerals tablet 1 tablet  1 tablet Oral Daily Eustace Moore, MD   1 tablet at 10/22/22  0914   ondansetron (ZOFRAN) tablet 4 mg  4 mg Oral Q6H PRN Eustace Moore, MD       Or   ondansetron [facility]) injection 4 mg  4 mg Intravenous Q6H PRN Eustace Moore, MD       oxyCODONE (Oxy IR/ROXICODONE) immediate release tablet 5 mg  5 mg Oral Q3H PRN Eustace Moore, MD   5 mg at 10/22/22 0915   senna (SENOKOT) tablet 8.6 mg  1 tablet Oral BID Eustace Moore, MD   8.6 mg at 10/22/22 0915   sodium chloride flush (NS) 0.9 % injection 3 mL  3 mL Intravenous Q12H Eustace Moore, MD       sodium chloride flush (NS) 0.9 % injection 3 mL  3 mL Intravenous PRN Eustace Moore, MD       timolol (TIMOPTIC) 0.5 % ophthalmic solution 1 drop  1 drop Both Eyes Daily Eustace Moore, MD   1 drop at 10/22/22 [id number]     Discharge Medications: Please see discharge summary for a list of discharge medications.  Relevant Imaging Results:  Relevant Lab Results:   Additional Information SS#: [id number]  Pollie Friar, RN

## 2022-10-22 NOTE — Care Management Obs Status (Signed)
[place] NOTIFICATION   Patient Details  Name: Kristen Alvarado MRN: [id number] Date of Birth: April 14, 1956   Medicare Observation Status Notification Given:  Yes    Pollie Friar, RN 10/22/2022, 3:00 PM

## 2022-10-22 NOTE — Progress Notes (Signed)
Physical Therapy Evaluation Patient Details Name: Kristen Alvarado MRN: [id number] DOB: 1956-04-06 Today's Date: 10/22/2022  History of Present Illness  Pt is a 67 y/o F who presents s/p L4-S1 PLIF on 10/21/22.  PMH includes: DM and HTN  Clinical Impression  Pt admitted with above diagnosis. At the time of PT eval, pt was able to demonstrate transfers and ambulation with gross mini guard assist and RW for support. Pt was educated on precautions, brace application/wearing schedule, appropriate activity progression, and car transfer. Daughter present and concerned about pt returning to current living situation as she states it will be difficult for pt to ambulate from one part of the house to the other with a clear path. Pt and daughter asking for SNF level rehab while daughter cleans out the house. Based on performance today, feel pt will be able to manage at home with daughter's support, as pt reports she is currently mobilizing better than she was prior to surgery. At baseline, pt does not get into the shower. If pt does end up returning home at d/c, recommend [facility] therapy follow up and a BSC to decrease risk for falls during nighttime bathroom trips. Pt currently with functional limitations due to the deficits listed below (see PT Problem List). Pt will benefit from skilled PT to increase their independence and safety with mobility to allow discharge to the venue listed below.         Recommendations for follow up therapy are one component of a multi-disciplinary discharge planning process, led by the attending physician.  Recommendations may be updated based on patient status, additional functional criteria and insurance authorization.  Follow Up Recommendations Skilled nursing-short term rehab (<3 hours/day) Can patient physically be transported by private vehicle: Yes    Assistance Recommended at Discharge Intermittent Supervision/Assistance  Patient can return home with the following  A little  help with walking and/or transfers;A little help with bathing/dressing/bathroom;Assistance with cooking/housework;Assist for transportation;Help with stairs or ramp for entrance    Equipment Recommendations BSC/3in1  Recommendations for Other Services       Functional Status Assessment Patient has had a recent decline in their functional status and demonstrates the ability to make significant improvements in function in a reasonable and predictable amount of time.     Precautions / Restrictions Precautions Precautions: Back Precaution Booklet Issued: Yes (comment) Precaution Comments: verbalized understanding Required Braces or Orthoses: Spinal Brace Spinal Brace: Lumbar corset Restrictions Weight Bearing Restrictions: No      Mobility  Bed Mobility Overal bed mobility: Needs Assistance Bed Mobility: Sidelying to Sit, Rolling Rolling: Min guard Sidelying to sit: Supervision       General bed mobility comments: VC's for optimal log roll technique. HOB flat and rails lowered to simulate home environment.    Transfers Overall transfer level: Needs assistance Equipment used: Rolling walker (2 wheels) Transfers: Sit to/from Stand Sit to Stand: Min guard           General transfer comment: No physical assist required, however close guard provided for safety.    Ambulation/Gait Ambulation/Gait assistance: Min guard, Supervision Gait Distance (Feet): 300 Feet Assistive device: Rolling walker (2 wheels) Gait Pattern/deviations: Step-through pattern, Decreased stride length Gait velocity: Decreased but likely baseline Gait velocity interpretation: 1.31 - 2.62 ft/sec, indicative of limited community ambulator   General Gait Details: VC's for improved posture, closer walker proximity, and forward gaze. No assist required and no overt LOB noted. Pt reports she is ambulating better than prior to surgery.  Stairs  Stairs: Yes Stairs assistance: Min guard, Min assist Stair  Management: One rail Right, Step to pattern, Forwards Number of Stairs: 3 General stair comments: HHA on the L. Pt required light intermittent assist but able to negotiate 3 stairs without difficulty.  Wheelchair Mobility    Modified Rankin (Stroke Patients Only)       Balance Overall balance assessment: Needs assistance Sitting-balance support: Feet supported Sitting balance-Leahy Scale: Good     Standing balance support: Reliant on assistive device for balance Standing balance-Leahy Scale: Fair                               Pertinent Vitals/Pain Pain Assessment Pain Assessment: Faces Faces Pain Scale: Hurts a little bit Pain Location: incisional Pain Descriptors / Indicators: Operative site guarding Pain Intervention(s): Limited activity within patient's tolerance, Monitored during session, Repositioned    Home Living Family/patient expects to be discharged to:: Private residence Living Arrangements: Children Available Help at Discharge: Family;Available 24 hours/day Type of Home: House Home Access: Stairs to enter Entrance Stairs-Rails: Right;Left;Can reach both Entrance Stairs-Number of Steps: 3   Home Layout: One level Home Equipment: Conservation officer, nature (2 wheels);Cane - single point      Prior Function Prior Level of Function : Independent/Modified Independent;Driving             Mobility Comments: Pt furniture walks or uses a long duster for support in the house per daughter. Intermittent cane use since Christmas ADLs Comments: Pt reports she has not gotten in the shower in ~1 year. She states she washes up at the sink as she is afraid she will fall.     Hand Dominance   Dominant Hand: Right    Extremity/Trunk Assessment   Upper Extremity Assessment Upper Extremity Assessment: Defer to OT evaluation    Lower Extremity Assessment Lower Extremity Assessment: Generalized weakness    Cervical / Trunk Assessment Cervical / Trunk  Assessment: Back Surgery  Communication   Communication: No difficulties  Cognition Arousal/Alertness: Awake/alert Behavior During Therapy: WFL for tasks assessed/performed Overall Cognitive Status: Within Functional Limits for tasks assessed                                          General Comments      Exercises     Assessment/Plan    PT Assessment Patient needs continued PT services  PT Problem List Decreased strength;Decreased activity tolerance;Decreased balance;Decreased mobility;Decreased knowledge of use of DME;Decreased safety awareness;Decreased knowledge of precautions;Pain       PT Treatment Interventions DME instruction;Gait training;Stair training;Functional mobility training;Therapeutic activities;Therapeutic exercise;Balance training;Patient/family education    PT Goals (Current goals can be found in the Care Plan section)  Acute Rehab PT Goals Patient Stated Goal: Be able to return to her home PT Goal Formulation: With patient/family Time For Goal Achievement: 10/29/22 Potential to Achieve Goals: Good    Frequency Min 5X/week     Co-evaluation               AM-PAC PT "6 Clicks" Mobility  Outcome Measure Help needed turning from your back to your side while in a flat bed without using bedrails?: A Little Help needed moving from lying on your back to sitting on the side of a flat bed without using bedrails?: A Little Help needed moving to and from a bed to  a chair (including a wheelchair)?: A Little Help needed standing up from a chair using your arms (e.g., wheelchair or bedside chair)?: A Little Help needed to walk in hospital room?: A Little Help needed climbing 3-5 steps with a railing? : A Little 6 Click Score: 18    End of Session Equipment Utilized During Treatment: Gait belt;Back brace Activity Tolerance: Patient tolerated treatment well Patient left: in bed;with call bell/phone within reach;with family/visitor  present Nurse Communication: Mobility status PT Visit Diagnosis: Unsteadiness on feet (R26.81);Pain Pain - part of body:  (back)    Time: [id number] PT Time Calculation (min) (ACUTE ONLY): 43 min   Charges:   PT Evaluation $PT Eval Low Complexity: 1 Low PT Treatments $Gait Training: 23-37 mins        Rolinda Roan, PT, DPT Acute Rehabilitation Services Secure Chat Preferred Office: [phone]   Thelma Comp 10/22/2022, 12:58 PM

## 2022-10-22 NOTE — Progress Notes (Signed)
Patient ID: Kristen Alvarado, female   DOB: 06-03-1956, 67 y.o.   MRN: [id number] Subjective: Patient reports mild back soreness, no leg pain or NTW  Objective: Vital signs in last 24 hours: Temp:  [97.6 F (36.4 C)-98.6 F (37 C)] 98.6 F (37 C) (02/13 0734) Pulse Rate:  [58-73] 69 (02/13 0734) Resp:  [8-20] 16 (02/13 0734) BP: (126-214)/(58-96) 150/58 (02/13 0734) SpO2:  [92 %-100 %] 99 % (02/13 0734) Weight:  [108.9 kg] 108.9 kg (02/12 0926)  Intake/Output from previous day: 02/12 0701 - 02/13 0700 In: 1600 [I.V.:1500; IV Piggyback:100] Out: 500 [Urine:250; Blood:250] Intake/Output this shift: No intake/output data recorded.  Neurologic: Grossly normal  Lab Results: Lab Results  Component Value Date   WBC 5.5 10/10/2022   HGB 11.8 (L) 10/10/2022   HCT 36.1 10/10/2022   MCV 81.7 10/10/2022   PLT 282 10/10/2022   Lab Results  Component Value Date   INR 1.0 10/10/2022   BMET Lab Results  Component Value Date   NA 140 10/10/2022   K 3.4 (L) 10/10/2022   CL 103 10/10/2022   CO2 30 10/10/2022   GLUCOSE 146 (H) 10/10/2022   BUN 11 10/10/2022   CREATININE 0.88 10/10/2022   CALCIUM 9.2 10/10/2022    Studies/Results: DG Lumbar Spine 2-3 Views  Result Date: 10/21/2022 CLINICAL DATA:  [id number] Surgery, elective [id number] EXAM: LUMBAR SPINE - 2-3 VIEW COMPARISON:  Radiograph 08/14/2022 FINDINGS: Intraoperative images during L4-S1 anterior posterior fusion. Improved alignment with mild residual anterolisthesis at L4-L5. IMPRESSION: Intraoperative images during L4-S1 lumbosacral fusion. Electronically Signed   By: Maurine Simmering M.D.   On: 10/21/2022 16:12   DG C-Arm 1-60 Min-No Report  Result Date: 10/21/2022 Fluoroscopy was utilized by the requesting physician.  No radiographic interpretation.   DG C-Arm 1-60 Min-No Report  Result Date: 10/21/2022 Fluoroscopy was utilized by the requesting physician.  No radiographic interpretation.   DG C-Arm 1-60 Min-No Report  Result  Date: 10/21/2022 Fluoroscopy was utilized by the requesting physician.  No radiographic interpretation.   DG C-Arm 1-60 Min-No Report  Result Date: 10/21/2022 Fluoroscopy was utilized by the requesting physician.  No radiographic interpretation.   DG C-Arm 1-60 Min-No Report  Result Date: 10/21/2022 Fluoroscopy was utilized by the requesting physician.  No radiographic interpretation.    Assessment/Plan: Pt/ot  Doing well POD1  SNF?  Estimated body mass index is 38.74 kg/m as calculated from the following:   Height as of this encounter: 5' 6"$  (1.676 m).   Weight as of this encounter: 108.9 kg.    LOS: 0 days    Eustace Moore 10/22/2022, 7:43 AM

## 2022-10-22 NOTE — Evaluation (Signed)
Occupational Therapy Evaluation Patient Details Name: Kristen Alvarado MRN: [id number] DOB: 19-Aug-1956 Today's Date: 10/22/2022   History of Present Illness 67 yo F adm for PLIF.  PMH includes: DM and HTN   Clinical Impression   Patient admitted for the scheduled procedure above.  PTA she lives with her daughter, uses a [facility] for mobility, and is able to complete her own ADL from a sit to stand level.  Patient continues to drive locally, but daughter completes meal prep and iADL.  Primary deficits is soreness to incisional site.  Currently she is needing up to [place] for lower body ADL, and generalized supervision for in room mobility.  Patient is unsure regarding discharge disposition.  If daughter is able to provide level of assist needed at home, then home is a possibility.  SNF is being considered for post acute rehab.  OT can continue efforts in the acute setting to address deficits listed.          Recommendations for follow up therapy are one component of a multi-disciplinary discharge planning process, led by the attending physician.  Recommendations may be updated based on patient status, additional functional criteria and insurance authorization.   Follow Up Recommendations  Other (comment) (SNF versus home with assist as needed)     Assistance Recommended at Discharge Intermittent Supervision/Assistance  Patient can return home with the following Assist for transportation;Assistance with cooking/housework    Functional Status Assessment  Patient has had a recent decline in their functional status and demonstrates the ability to make significant improvements in function in a reasonable and predictable amount of time.  Equipment Recommendations  Tub/shower bench    Recommendations for Other Services       Precautions / Restrictions Precautions Precautions: Back Precaution Booklet Issued: Yes (comment) Precaution Comments: verbalized understanding Required Braces or Orthoses:  Spinal Brace Spinal Brace: Lumbar corset Restrictions Weight Bearing Restrictions: No      Mobility Bed Mobility Overal bed mobility: Needs Assistance Bed Mobility: Sidelying to Sit, Sit to Sidelying   Sidelying to sit: Supervision     Sit to sidelying: Min assist General bed mobility comments: assist with legs back on bed    Transfers Overall transfer level: Needs assistance Equipment used: Rolling walker (2 wheels) Transfers: Sit to/from Stand, Bed to chair/wheelchair/BSC Sit to Stand: Supervision     Step pivot transfers: Supervision            Balance Overall balance assessment: Needs assistance Sitting-balance support: Feet supported Sitting balance-Leahy Scale: Good     Standing balance support: Reliant on assistive device for balance Standing balance-Leahy Scale: Fair                             ADL either performed or assessed with clinical judgement   ADL Overall ADL's : Needs assistance/impaired Eating/Feeding: Independent;Sitting   Grooming: Wash/dry hands;Wash/dry face;Supervision/safety;Standing   Upper Body Bathing: Set up;Standing   Lower Body Bathing: Minimal assistance;Sit to/from stand   Upper Body Dressing : Supervision/safety;Sitting   Lower Body Dressing: Minimal assistance;Sit to/from stand   Toilet Transfer: Supervision/safety;Rolling walker (2 wheels);Regular Toilet;Ambulation                   Vision Patient Visual Report: No change from baseline       Perception     Praxis      Pertinent Vitals/Pain Pain Assessment Pain Assessment: Faces Faces Pain Scale: Hurts a little bit Pain  Location: incisional Pain Descriptors / Indicators: Operative site guarding Pain Intervention(s): Monitored during session     Hand Dominance Right   Extremity/Trunk Assessment Upper Extremity Assessment Upper Extremity Assessment: Overall WFL for tasks assessed   Lower Extremity Assessment Lower Extremity  Assessment: Defer to PT evaluation   Cervical / Trunk Assessment Cervical / Trunk Assessment: Back Surgery   Communication Communication Communication: No difficulties   Cognition Arousal/Alertness: Awake/alert Behavior During Therapy: WFL for tasks assessed/performed Overall Cognitive Status: Within Functional Limits for tasks assessed                                       General Comments   VSS on RA    Exercises     Shoulder Instructions      Home Living Family/patient expects to be discharged to:: Private residence Living Arrangements: Children Available Help at Discharge: Family;Available 24 hours/day Type of Home: House Home Access: Stairs to enter [employer] of Steps: 3 Entrance Stairs-Rails: Right;Left;Can reach both Home Layout: One level     Bathroom Shower/Tub: Teacher, early years/pre: Standard Bathroom Accessibility: Yes How Accessible: Accessible via walker Home Equipment: [place] (2 wheels);Cane - single point          Prior Functioning/Environment Prior Level of Function : Independent/Modified Independent;Driving                        OT Problem List: Impaired balance (sitting and/or standing);Pain      OT Treatment/Interventions: Self-care/ADL training;Therapeutic activities;Balance training;Patient/family education;DME and/or AE instruction    OT Goals(Current goals can be found in the care plan section) Acute Rehab OT Goals Patient Stated Goal: Return home OT Goal Formulation: With patient Time For Goal Achievement: 11/05/22 Potential to Achieve Goals: Good ADL Goals Pt Will Perform Grooming: with modified independence;standing Pt Will Perform Lower Body Dressing: with modified independence;sit to/from stand Pt Will Transfer to Toilet: with modified independence;regular height toilet;ambulating  OT Frequency: Min 2X/week    Co-evaluation              AM-PAC OT "6 Clicks"  Daily Activity     Outcome Measure Help from another person eating meals?: None Help from another person taking care of personal grooming?: None Help from another person toileting, which includes using toliet, bedpan, or urinal?: A Little Help from another person bathing (including washing, rinsing, drying)?: A Little Help from another person to put on and taking off regular upper body clothing?: None Help from another person to put on and taking off regular lower body clothing?: A Little 6 Click Score: 21   End of Session Equipment Utilized During Treatment: Rolling walker (2 wheels);Back brace Nurse Communication: Mobility status  Activity Tolerance: Patient tolerated treatment well Patient left: in bed;with call bell/phone within reach  OT Visit Diagnosis: Unsteadiness on feet (R26.81);Pain Pain - Right/Left:  (back)                Time: [id number] OT Time Calculation (min): 25 min Charges:  OT General Charges $OT Visit: 1 Visit OT Evaluation $OT Eval Moderate Complexity: 1 Mod OT Treatments $Self Care/Home Management : 8-22 mins  10/22/2022  RP, OTR/L  Acute Rehabilitation Services  Office:  [phone]   Metta Clines 10/22/2022, 9:27 AM

## 2022-10-23 DIAGNOSIS — R41841 Cognitive communication deficit: Secondary | ICD-10-CM | POA: Diagnosis not present

## 2022-10-23 DIAGNOSIS — E1165 Type 2 diabetes mellitus with hyperglycemia: Secondary | ICD-10-CM | POA: Diagnosis not present

## 2022-10-23 DIAGNOSIS — M5459 Other low back pain: Secondary | ICD-10-CM | POA: Diagnosis not present

## 2022-10-23 DIAGNOSIS — M5117 Intervertebral disc disorders with radiculopathy, lumbosacral region: Secondary | ICD-10-CM | POA: Diagnosis not present

## 2022-10-23 DIAGNOSIS — Z7984 Long term (current) use of oral hypoglycemic drugs: Secondary | ICD-10-CM | POA: Diagnosis not present

## 2022-10-23 DIAGNOSIS — R278 Other lack of coordination: Secondary | ICD-10-CM | POA: Diagnosis not present

## 2022-10-23 DIAGNOSIS — E119 Type 2 diabetes mellitus without complications: Secondary | ICD-10-CM | POA: Diagnosis not present

## 2022-10-23 DIAGNOSIS — R2681 Unsteadiness on feet: Secondary | ICD-10-CM | POA: Diagnosis not present

## 2022-10-23 DIAGNOSIS — M4326 Fusion of spine, lumbar region: Secondary | ICD-10-CM | POA: Diagnosis not present

## 2022-10-23 DIAGNOSIS — M4807 Spinal stenosis, lumbosacral region: Secondary | ICD-10-CM | POA: Diagnosis not present

## 2022-10-23 DIAGNOSIS — M4316 Spondylolisthesis, lumbar region: Secondary | ICD-10-CM | POA: Diagnosis not present

## 2022-10-23 DIAGNOSIS — M48061 Spinal stenosis, lumbar region without neurogenic claudication: Secondary | ICD-10-CM | POA: Diagnosis not present

## 2022-10-23 DIAGNOSIS — M6281 Muscle weakness (generalized): Secondary | ICD-10-CM | POA: Diagnosis not present

## 2022-10-23 DIAGNOSIS — E785 Hyperlipidemia, unspecified: Secondary | ICD-10-CM | POA: Diagnosis not present

## 2022-10-23 DIAGNOSIS — E782 Mixed hyperlipidemia: Secondary | ICD-10-CM | POA: Diagnosis not present

## 2022-10-23 DIAGNOSIS — Z79899 Other long term (current) drug therapy: Secondary | ICD-10-CM | POA: Diagnosis not present

## 2022-10-23 DIAGNOSIS — I10 Essential (primary) hypertension: Secondary | ICD-10-CM | POA: Diagnosis not present

## 2022-10-23 DIAGNOSIS — R2689 Other abnormalities of gait and mobility: Secondary | ICD-10-CM | POA: Diagnosis not present

## 2022-10-23 LAB — GLUCOSE, CAPILLARY
Glucose-Capillary: 132 mg/dL — ABNORMAL HIGH (ref 70–99)
Glucose-Capillary: 153 mg/dL — ABNORMAL HIGH (ref 70–99)

## 2022-10-23 MED ORDER — HYDROCODONE-ACETAMINOPHEN 5-325 MG PO TABS: 1.0000 | ORAL_TABLET | ORAL | 0 refills | Status: AC | PRN

## 2022-10-23 MED ORDER — METHOCARBAMOL 500 MG PO TABS: 500.0000 mg | ORAL_TABLET | Freq: Four times a day (QID) | ORAL | 0 refills | Status: AC | PRN

## 2022-10-23 NOTE — Plan of Care (Signed)
Attempted to call report to [facility], spoke with the front desk, and was told that the Pt was going to room 10. The front desk transferred the call to the nursing station, the phone rang but no one answered. Holli Humbles, RN

## 2022-10-23 NOTE — TOC Transition Note (Signed)
Transition of Care [facility]) - CM/SW Discharge Note   Patient Details  Name: Kristen Alvarado MRN: [id number] Date of Birth: November 18, 1955  [facility] CM/SW Contact:  Geralynn Ochs, LCSW Phone Number: 10/23/2022, 12:34 PM   Clinical Narrative:   CSW notified that insurance approved SN[facility] today. CSW sent discharge information, updated daughter by phone who will provide transportation. No other TOC needs at this time.  Nurse to call report to [phone], Room 103    Final next level of care: Skilled Nursing Facility Barriers to Discharge: Barriers Resolved   Patient Goals and CMS Choice CMS Medicare.gov Compare Post Acute Care list provided to:: Patient Choice offered to / list presented to : Patient, Adult Children  Discharge Placement                Patient chooses bed at: [facility] Patient to be transferred to facility by: Family Name of family member notified: Shelon Patient and family notified of of transfer: 10/23/22  Discharge Plan and Services Additional resources added to the After Visit Summary for   In-house Referral: Clinical Social Work Discharge Planning Services: CM Consult Post Acute Care Choice: [place]                               Social Determinants of Health (SDOH) Interventions SDOH Screenings   Tobacco Use: Low Risk  (10/21/2022)     Readmission Risk Interventions     No data to display

## 2022-10-23 NOTE — Progress Notes (Signed)
Physical Therapy Treatment  Patient Details Name: Kristen Alvarado MRN: [id number] DOB: [birth year] Today's Date: 10/23/2022   History of Present Illness Pt is a 67 y/o F who presents s/p L4-S1 PLIF on 10/21/22.  PMH significant for DM and HTN.    PT Comments    Pt progressing well with post-op mobility. She was able to demonstrate transfers and ambulation with gross min guard assist and RW for support. Reinforced education on precautions, brace application/wearing schedule, appropriate activity progression, and car transfer. Will continue to follow.      Recommendations for follow up therapy are one component of a multi-disciplinary discharge planning process, led by the attending physician.  Recommendations may be updated based on patient status, additional functional criteria and insurance authorization.  Follow Up Recommendations  Skilled nursing-short term rehab (<3 hours/day) Can patient physically be transported by private vehicle: Yes   Assistance Recommended at Discharge Intermittent Supervision/Assistance  Patient can return home with the following A little help with walking and/or transfers;A little help with bathing/dressing/bathroom;Assistance with cooking/housework;Assist for transportation;Help with stairs or ramp for entrance   Equipment Recommendations  BSC/3in1    Recommendations for Other Services       Precautions / Restrictions Precautions Precautions: Back Precaution Booklet Issued: Yes (comment) Precaution Comments: reviewed precautions, patient verbalized understanding Required Braces or Orthoses: Spinal Brace Spinal Brace: Lumbar corset Restrictions Weight Bearing Restrictions: No     Mobility  Bed Mobility               General bed mobility comments: Pt was received sitting up in the recliner. Educated on positioning recommendations.    Transfers Overall transfer level: Needs assistance Equipment used: Rolling walker (2 wheels) Transfers:  Sit to/from Stand Sit to Stand: Min guard           General transfer comment: VC's for hand placement on seated surface for safety and wide BOS.    Ambulation/Gait Ambulation/Gait assistance: Min guard Gait Distance (Feet): 300 Feet Assistive device: Rolling walker (2 wheels) Gait Pattern/deviations: Step-through pattern, Decreased stride length Gait velocity: Decreased but likely baseline Gait velocity interpretation: 1.31 - 2.62 ft/sec, indicative of limited community ambulator   General Gait Details: VC's for improved posture, closer walker proximity, and forward gaze. Pt required several standing rest breaks this session due to fatigue and pain.   Stairs             Wheelchair Mobility    Modified Rankin (Stroke Patients Only)       Balance Overall balance assessment: Needs assistance Sitting-balance support: Feet supported Sitting balance-Leahy Scale: Good     Standing balance support: Single extremity supported, Bilateral upper extremity supported, During functional activity Standing balance-Leahy Scale: Fair Standing balance comment: reliant on sink or RW for support                            Cognition Arousal/Alertness: Awake/alert Behavior During Therapy: WFL for tasks assessed/performed Overall Cognitive Status: Within Functional Limits for tasks assessed                                          Exercises      General Comments        Pertinent Vitals/Pain Pain Assessment Pain Assessment: Faces Faces Pain Scale: Hurts little more Pain Location: incisional Pain Descriptors / Indicators: Operative site guarding  Pain Intervention(s): Limited activity within patient's tolerance, Monitored during session, Repositioned    Home Living                          Prior Function            PT Goals (current goals can now be found in the care plan section) Acute Rehab PT Goals Patient Stated Goal: Be able  to return to her home PT Goal Formulation: With patient/family Time For Goal Achievement: 10/29/22 Potential to Achieve Goals: Good Progress towards PT goals: Progressing toward goals    Frequency    Min 5X/week      PT Plan Current plan remains appropriate    Co-evaluation              AM-PAC PT "6 Clicks" Mobility   Outcome Measure  Help needed turning from your back to your side while in a flat bed without using bedrails?: A Little Help needed moving from lying on your back to sitting on the side of a flat bed without using bedrails?: A Little Help needed moving to and from a bed to a chair (including a wheelchair)?: A Little Help needed standing up from a chair using your arms (e.g., wheelchair or bedside chair)?: A Little Help needed to walk in hospital room?: A Little Help needed climbing 3-5 steps with a railing? : A Little 6 Click Score: 18    End of Session Equipment Utilized During Treatment: Gait belt;Back brace Activity Tolerance: Patient tolerated treatment well Patient left: in bed;with call bell/phone within reach;with family/visitor present Nurse Communication: Mobility status PT Visit Diagnosis: Unsteadiness on feet (R26.81);Pain Pain - part of body:  (back)     Time: [id number] PT Time Calculation (min) (ACUTE ONLY): 20 min  Charges:  $Gait Training: 8-22 mins                     Rolinda Roan, PT, DPT Acute Rehabilitation Services Secure Chat Preferred Office: [phone]    Thelma Comp 10/23/2022, 10:33 AM

## 2022-10-23 NOTE — Discharge Summary (Signed)
Physician Discharge Summary  Patient ID: JOBETH FURTAW MRN: [id number] DOB/AGE: March 12, 1956 67 y.o.  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 The patient tolerated the procedure well and was taken to the recovery room and then to the floor in stable condition. The hospital course was routine. There were no complications. The wound remained clean dry and intact. Pt had appropriate back soreness. No complaints of leg pain or new N/T/W. The patient remained afebrile with stable vital signs, and tolerated a regular diet. The patient continued to increase activities, and pain was well controlled with oral pain medications.   Consults: None  Significant Diagnostic Studies:  Results for orders placed or performed during the hospital encounter of 10/21/22  Glucose, capillary  Result Value Ref Range   Glucose-Capillary 147 (H) 70 - 99 mg/dL   Comment 1 Notify RN    Comment 2 Document in Chart   Glucose, capillary  Result Value Ref Range   Glucose-Capillary 135 (H) 70 - 99 mg/dL  Glucose, capillary  Result Value Ref Range   Glucose-Capillary 214 (H) 70 - 99 mg/dL   Comment 1 Notify RN    Comment 2 Document in Chart   Glucose, capillary  Result Value Ref Range   Glucose-Capillary 121 (H) 70 - 99 mg/dL   Comment 1 Notify RN    Comment 2 Document in Chart   Glucose, capillary  Result Value Ref Range   Glucose-Capillary 142 (H) 70 - 99 mg/dL  Glucose, capillary  Result Value Ref Range   Glucose-Capillary 135 (H) 70 - 99 mg/dL  Glucose, capillary  Result Value Ref Range    Glucose-Capillary 126 (H) 70 - 99 mg/dL   Comment 1 Notify RN    Comment 2 Document in Chart   Glucose, capillary  Result Value Ref Range   Glucose-Capillary 132 (H) 70 - 99 mg/dL   Comment 1 Notify RN    Comment 2 Document in Chart   ABO/Rh  Result Value Ref Range   ABO/RH(D)      B POS Performed at [facility], [address], [place], [place] [address]     DG Lumbar Spine 2-3 Views  Result Date: 10/21/2022 CLINICAL DATA:  [id number] Surgery, elective [id number] EXAM: LUMBAR SPINE - 2-3 VIEW COMPARISON:  Radiograph 08/14/2022 FINDINGS: Intraoperative images during L4-S1 anterior posterior fusion. Improved alignment with mild residual anterolisthesis at L4-L5. IMPRESSION: Intraoperative images during L4-S1 lumbosacral fusion. Electronically Signed   By: Maurine Simmering M.D.   On: 10/21/2022 16:12   DG C-Arm 1-60 Min-No Report  Result Date: 10/21/2022 Fluoroscopy was utilized by the requesting physician.  No radiographic interpretation.   DG C-Arm 1-60 Min-No Report  Result Date: 10/21/2022 Fluoroscopy was utilized by the requesting physician.  No radiographic interpretation.   DG C-Arm 1-60 Min-No Report  Result Date: 10/21/2022 Fluoroscopy was utilized by the requesting physician.  No radiographic interpretation.   DG C-Arm 1-60 Min-No Report  Result Date: 10/21/2022 Fluoroscopy was utilized by the requesting physician.  No radiographic interpretation.   DG C-Arm 1-60 Min-No Report  Result Date: 10/21/2022 Fluoroscopy was utilized by the requesting  physician.  No radiographic interpretation.    Antibiotics:  Anti-infectives (From admission, onward)    Start     Dose/Rate Route Frequency Ordered Stop   10/21/22 2000  ceFAZolin (ANCEF) IVPB 2g/100 mL premix        2 g 200 mL/hr over 30 Minutes Intravenous Every 8 hours 10/21/22 1722 10/22/22 0446   10/21/22 1540  vancomycin (VANCOCIN) powder  Status:  Discontinued          As needed 10/21/22 1540 10/21/22 1604   10/21/22  0901  ceFAZolin (ANCEF) 2-4 GM/100ML-% IVPB       Note to Pharmacy: [place] Floor: cabinet override      10/21/22 0901 10/21/22 1120   10/21/22 0900  ceFAZolin (ANCEF) IVPB 2g/100 mL premix        2 g 200 mL/hr over 30 Minutes Intravenous On call to O.R. 10/21/22 0857 10/21/22 1119       Discharge Exam: Blood pressure (!) 140/61, pulse 65, temperature 97.9 F (36.6 C), resp. rate 16, height 5' 6"$  (1.676 m), weight 108.9 kg, SpO2 100 %. Neurologic: Grossly normal Ambulating and voiding well incision cdi   Discharge Medications:   Allergies as of 10/23/2022   No Known Allergies      Medication List     TAKE these medications    atorvastatin 20 MG tablet Commonly known as: LIPITOR Take 20 mg by mouth daily.   cholecalciferol 25 MCG (1000 UNIT) tablet Commonly known as: VITAMIN D3 Take 1,000 Units by mouth daily.   cloNIDine 0.2 MG tablet Commonly known as: CATAPRES Take 0.2 mg by mouth 2 (two) times daily.   hydrochlorothiazide 25 MG tablet Commonly known as: HYDRODIURIL Take 25 mg by mouth daily.   HYDROcodone-acetaminophen 5-325 MG tablet Commonly known as: NORCO/VICODIN Take 1 tablet by mouth every 4 (four) hours as needed for moderate pain. What changed: when to take this   lisinopril 40 MG tablet Commonly known as: ZESTRIL Take 40 mg by mouth daily.   metFORMIN 500 MG 24 hr tablet Commonly known as: GLUCOPHAGE-XR Take 500 mg by mouth daily with breakfast.   methocarbamol 500 MG tablet Commonly known as: ROBAXIN Take 1 tablet (500 mg total) by mouth every 6 (six) hours as needed for muscle spasms.   multivitamin with minerals Tabs tablet Take 1 tablet by mouth daily.   timolol 0.5 % ophthalmic solution Commonly known as: TIMOPTIC Place 1 drop into both eyes daily.               Durable Medical Equipment  (From admission, onward)           Start     Ordered   10/21/22 1745  DME Walker rolling  Once       Question:  Patient needs a  walker to treat with the following condition  Answer:  S/P lumbar fusion   10/21/22 1744   10/21/22 1745  DME 3 n 1  Once        10/21/22 1744            Disposition: SNF   Final Dx: PLIF L4-5, L5-S1  Discharge Instructions     Call MD for:  difficulty breathing, headache or visual disturbances   Complete by: As directed    Call MD for:  hives   Complete by: As directed    Call MD for:  persistant dizziness or light-headedness   Complete by: As directed    Call MD for:  persistant nausea and  vomiting   Complete by: As directed    Call MD for:  redness, tenderness, or signs of infection (pain, swelling, redness, odor or green/yellow discharge around incision site)   Complete by: As directed    Call MD for:  severe uncontrolled pain   Complete by: As directed    Call MD for:  temperature >100.4   Complete by: As directed    Diet - low sodium heart healthy   Complete by: As directed    Increase activity slowly   Complete by: As directed    Lifting restrictions   Complete by: As directed    No lifting more than 8 lbs          Signed: Ocie Cornfield Oluwatoyin Banales 10/23/2022, 12:03 PM

## 2022-10-23 NOTE — Progress Notes (Signed)
Pt given D/C packet with Rx to carry to [facility]. Attempted to call report but no answer once I was transferred back to the nursing station. Pt D/C'd to SNF via wheelchair. Transported to SNF by Pt's daughter. Holli Humbles, RN

## 2022-10-23 NOTE — Progress Notes (Signed)
Occupational Therapy Treatment Patient Details Name: Kristen Alvarado MRN: [id number] DOB: 1956-09-03 Today's Date: 10/23/2022   History of present illness Pt is a 67 y/o F who presents s/p L4-S1 PLIF on 10/21/22.  PMH includes: DM and HTN   OT comments  Patient demonstrated good progress with OT treatment. Patient able to demonstrate log rolling technique with getting to EOB with supervision. Patient able to perform toilet transfers, grooming, and UB bathing standing at sink. Patient required min assist with donning brace and supervision for pullover top. Education provided for reacher use for donning underwear and pants with min assist. Patient declined further sock aide training. Patient performed side stepping to ambulate around bed with min guard assist for safety and cues for back precautions to avoid twisting. Patient discharge recommendations are for SNF unless patient can received assist at home.    Recommendations for follow up therapy are one component of a multi-disciplinary discharge planning process, led by the attending physician.  Recommendations may be updated based on patient status, additional functional criteria and insurance authorization.    Follow Up Recommendations  Other (comment) (SNF vs home with assistance as needed)     Assistance Recommended at Discharge Intermittent Supervision/Assistance  Patient can return home with the following  Assist for transportation;Assistance with cooking/housework   Equipment Recommendations  Tub/shower bench    Recommendations for Other Services      Precautions / Restrictions Precautions Precautions: Back Precaution Booklet Issued: Yes (comment) Precaution Comments: reviewed precautions, patient verbalized understanding Required Braces or Orthoses: Spinal Brace Spinal Brace: Lumbar corset Restrictions Weight Bearing Restrictions: No       Mobility Bed Mobility Overal bed mobility: Needs Assistance Bed Mobility:  Sidelying to Sit, Rolling Rolling: Supervision Sidelying to sit: Supervision       General bed mobility comments: demonstrated good understanding of log rolling technique    Transfers Overall transfer level: Needs assistance Equipment used: Rolling walker (2 wheels) Transfers: Sit to/from Stand Sit to Stand: Min guard     Step pivot transfers: Supervision     General transfer comment: min guard to supervision with transfers due to limited space in bathroom and to manever around bed     Balance Overall balance assessment: Needs assistance Sitting-balance support: Feet supported Sitting balance-Leahy Scale: Good     Standing balance support: Single extremity supported, Bilateral upper extremity supported, During functional activity Standing balance-Leahy Scale: Fair Standing balance comment: reliant on sink or RW for support                           ADL either performed or assessed with clinical judgement   ADL Overall ADL's : Needs assistance/impaired     Grooming: Wash/dry hands;Wash/dry face;Supervision/safety;Standing Grooming Details (indicate cue type and reason): at sink Upper Body Bathing: Set up;Standing Upper Body Bathing Details (indicate cue type and reason): at sink Lower Body Bathing: Min guard;Sit to/from stand Lower Body Bathing Details (indicate cue type and reason): at sink Upper Body Dressing : Supervision/safety;Sitting Upper Body Dressing Details (indicate cue type and reason): assistance to donn corset Lower Body Dressing: Minimal assistance;Adhering to back precautions;With adaptive equipment;Sit to/from stand Lower Body Dressing Details (indicate cue type and reason): donned pants and underwear with reacher with min assist Toilet Transfer: Supervision/safety;Rolling walker (2 wheels);Regular Toilet;Ambulation   Toileting- Clothing Manipulation and Hygiene: Supervision/safety         General ADL Comments: performed self care while  adhering to back precautions  Extremity/Trunk Assessment              Vision       Perception     Praxis      Cognition Arousal/Alertness: Awake/alert Behavior During Therapy: WFL for tasks assessed/performed Overall Cognitive Status: Within Functional Limits for tasks assessed                                 General Comments: able to recall precautions with cues        Exercises      Shoulder Instructions       General Comments      Pertinent Vitals/ Pain       Pain Assessment Pain Assessment: Faces Faces Pain Scale: Hurts a little bit Pain Location: incisional Pain Descriptors / Indicators: Operative site guarding Pain Intervention(s): Monitored during session, Repositioned  Home Living                                          Prior Functioning/Environment              Frequency  Min 2X/week        Progress Toward Goals  OT Goals(current goals can now be found in the care plan section)  Progress towards OT goals: Progressing toward goals  Acute Rehab OT Goals Patient Stated Goal: get better OT Goal Formulation: With patient Time For Goal Achievement: 11/05/22 Potential to Achieve Goals: Good ADL Goals Pt Will Perform Grooming: with modified independence;standing Pt Will Perform Lower Body Dressing: with modified independence;sit to/from stand Pt Will Transfer to Toilet: with modified independence;regular height toilet;ambulating  Plan Discharge plan remains appropriate    Co-evaluation                 AM-PAC OT "6 Clicks" Daily Activity     Outcome Measure   Help from another person eating meals?: None Help from another person taking care of personal grooming?: None Help from another person toileting, which includes using toliet, bedpan, or urinal?: A Little Help from another person bathing (including washing, rinsing, drying)?: A Little Help from another person to put on and taking off  regular upper body clothing?: None Help from another person to put on and taking off regular lower body clothing?: A Little 6 Click Score: 21    End of Session Equipment Utilized During Treatment: Rolling walker (2 wheels);Back brace  OT Visit Diagnosis: Unsteadiness on feet (R26.81);Pain   Activity Tolerance Patient tolerated treatment well   Patient Left in chair;with call bell/phone within reach   Nurse Communication Mobility status        Time: 0732-0804 OT Time Calculation (min): 32 min  Charges: OT General Charges $OT Visit: 1 Visit OT Treatments $Self Care/Home Management : 23-37 mins  Lodema Hong, [place]  Office [place] 10/23/2022, 9:14 AM

## 2022-10-24 DIAGNOSIS — E782 Mixed hyperlipidemia: Secondary | ICD-10-CM | POA: Diagnosis not present

## 2022-10-24 DIAGNOSIS — I10 Essential (primary) hypertension: Secondary | ICD-10-CM | POA: Diagnosis not present

## 2022-10-24 DIAGNOSIS — M5459 Other low back pain: Secondary | ICD-10-CM | POA: Diagnosis not present

## 2022-10-24 DIAGNOSIS — M4316 Spondylolisthesis, lumbar region: Secondary | ICD-10-CM | POA: Diagnosis not present

## 2022-10-24 DIAGNOSIS — E1165 Type 2 diabetes mellitus with hyperglycemia: Secondary | ICD-10-CM | POA: Diagnosis not present

## 2022-10-24 DIAGNOSIS — M6281 Muscle weakness (generalized): Secondary | ICD-10-CM | POA: Diagnosis not present

## 2022-10-24 NOTE — Anesthesia Postprocedure Evaluation (Signed)
Anesthesia Post Note  Patient: Kristen Alvarado  Procedure(s) Performed: POSTERIOR LUMBAR INTERBODY FUSION LUMBAR FOUR THROUGH LUMBAR FIVE LUMBAR FIVE THROUGH SACRAL ONE POSTERIOR LATERAL AND INTERBODY FUSION (Spine Lumbar)     Patient location during evaluation: PACU Anesthesia Type: General Level of consciousness: sedated Pain management: pain level controlled Vital Signs Assessment: post-procedure vital signs reviewed and stable Respiratory status: spontaneous breathing and respiratory function stable Cardiovascular status: stable Postop Assessment: no apparent nausea or vomiting Anesthetic complications: no   No notable events documented.               Aibhlinn Kalmar DANIEL

## 2022-10-25 DIAGNOSIS — R2689 Other abnormalities of gait and mobility: Secondary | ICD-10-CM | POA: Diagnosis not present

## 2022-10-25 DIAGNOSIS — M6281 Muscle weakness (generalized): Secondary | ICD-10-CM | POA: Diagnosis not present

## 2022-10-25 DIAGNOSIS — R278 Other lack of coordination: Secondary | ICD-10-CM | POA: Diagnosis not present

## 2022-10-25 DIAGNOSIS — E1165 Type 2 diabetes mellitus with hyperglycemia: Secondary | ICD-10-CM | POA: Diagnosis not present

## 2022-10-25 DIAGNOSIS — M5459 Other low back pain: Secondary | ICD-10-CM | POA: Diagnosis not present

## 2022-10-25 DIAGNOSIS — M4316 Spondylolisthesis, lumbar region: Secondary | ICD-10-CM | POA: Diagnosis not present

## 2022-10-25 DIAGNOSIS — I10 Essential (primary) hypertension: Secondary | ICD-10-CM | POA: Diagnosis not present

## 2022-10-25 DIAGNOSIS — M4326 Fusion of spine, lumbar region: Secondary | ICD-10-CM | POA: Diagnosis not present

## 2022-10-25 DIAGNOSIS — E782 Mixed hyperlipidemia: Secondary | ICD-10-CM | POA: Diagnosis not present

## 2022-10-29 DIAGNOSIS — M4326 Fusion of spine, lumbar region: Secondary | ICD-10-CM | POA: Diagnosis not present

## 2022-10-29 DIAGNOSIS — E782 Mixed hyperlipidemia: Secondary | ICD-10-CM | POA: Diagnosis not present

## 2022-10-29 DIAGNOSIS — M4316 Spondylolisthesis, lumbar region: Secondary | ICD-10-CM | POA: Diagnosis not present

## 2022-10-29 DIAGNOSIS — M6281 Muscle weakness (generalized): Secondary | ICD-10-CM | POA: Diagnosis not present

## 2022-10-29 DIAGNOSIS — R278 Other lack of coordination: Secondary | ICD-10-CM | POA: Diagnosis not present

## 2022-10-29 DIAGNOSIS — R2689 Other abnormalities of gait and mobility: Secondary | ICD-10-CM | POA: Diagnosis not present

## 2022-10-29 DIAGNOSIS — E1165 Type 2 diabetes mellitus with hyperglycemia: Secondary | ICD-10-CM | POA: Diagnosis not present

## 2022-10-29 DIAGNOSIS — M5459 Other low back pain: Secondary | ICD-10-CM | POA: Diagnosis not present

## 2022-10-29 DIAGNOSIS — I10 Essential (primary) hypertension: Secondary | ICD-10-CM | POA: Diagnosis not present

## 2022-10-30 DIAGNOSIS — E1165 Type 2 diabetes mellitus with hyperglycemia: Secondary | ICD-10-CM | POA: Diagnosis not present

## 2022-10-30 DIAGNOSIS — M4316 Spondylolisthesis, lumbar region: Secondary | ICD-10-CM | POA: Diagnosis not present

## 2022-10-30 DIAGNOSIS — M5459 Other low back pain: Secondary | ICD-10-CM | POA: Diagnosis not present

## 2022-10-30 DIAGNOSIS — M6281 Muscle weakness (generalized): Secondary | ICD-10-CM | POA: Diagnosis not present

## 2022-10-30 DIAGNOSIS — I10 Essential (primary) hypertension: Secondary | ICD-10-CM | POA: Diagnosis not present

## 2022-10-30 DIAGNOSIS — E782 Mixed hyperlipidemia: Secondary | ICD-10-CM | POA: Diagnosis not present

## 2022-11-01 DIAGNOSIS — E782 Mixed hyperlipidemia: Secondary | ICD-10-CM | POA: Diagnosis not present

## 2022-11-01 DIAGNOSIS — M4316 Spondylolisthesis, lumbar region: Secondary | ICD-10-CM | POA: Diagnosis not present

## 2022-11-01 DIAGNOSIS — M4326 Fusion of spine, lumbar region: Secondary | ICD-10-CM | POA: Diagnosis not present

## 2022-11-01 DIAGNOSIS — M6281 Muscle weakness (generalized): Secondary | ICD-10-CM | POA: Diagnosis not present

## 2022-11-01 DIAGNOSIS — R278 Other lack of coordination: Secondary | ICD-10-CM | POA: Diagnosis not present

## 2022-11-01 DIAGNOSIS — E1165 Type 2 diabetes mellitus with hyperglycemia: Secondary | ICD-10-CM | POA: Diagnosis not present

## 2022-11-01 DIAGNOSIS — M5459 Other low back pain: Secondary | ICD-10-CM | POA: Diagnosis not present

## 2022-11-01 DIAGNOSIS — I10 Essential (primary) hypertension: Secondary | ICD-10-CM | POA: Diagnosis not present

## 2022-11-01 DIAGNOSIS — R2689 Other abnormalities of gait and mobility: Secondary | ICD-10-CM | POA: Diagnosis not present

## 2022-11-04 DIAGNOSIS — M5459 Other low back pain: Secondary | ICD-10-CM | POA: Diagnosis not present

## 2022-11-04 DIAGNOSIS — E782 Mixed hyperlipidemia: Secondary | ICD-10-CM | POA: Diagnosis not present

## 2022-11-04 DIAGNOSIS — M4316 Spondylolisthesis, lumbar region: Secondary | ICD-10-CM | POA: Diagnosis not present

## 2022-11-04 DIAGNOSIS — I10 Essential (primary) hypertension: Secondary | ICD-10-CM | POA: Diagnosis not present

## 2022-11-04 DIAGNOSIS — E1165 Type 2 diabetes mellitus with hyperglycemia: Secondary | ICD-10-CM | POA: Diagnosis not present

## 2022-11-04 DIAGNOSIS — M6281 Muscle weakness (generalized): Secondary | ICD-10-CM | POA: Diagnosis not present

## 2022-11-05 DIAGNOSIS — R278 Other lack of coordination: Secondary | ICD-10-CM | POA: Diagnosis not present

## 2022-11-05 DIAGNOSIS — M6281 Muscle weakness (generalized): Secondary | ICD-10-CM | POA: Diagnosis not present

## 2022-11-05 DIAGNOSIS — R2689 Other abnormalities of gait and mobility: Secondary | ICD-10-CM | POA: Diagnosis not present

## 2022-11-05 DIAGNOSIS — M4326 Fusion of spine, lumbar region: Secondary | ICD-10-CM | POA: Diagnosis not present

## 2022-11-06 DIAGNOSIS — I10 Essential (primary) hypertension: Secondary | ICD-10-CM | POA: Diagnosis not present

## 2022-11-06 DIAGNOSIS — E1165 Type 2 diabetes mellitus with hyperglycemia: Secondary | ICD-10-CM | POA: Diagnosis not present

## 2022-11-06 DIAGNOSIS — E782 Mixed hyperlipidemia: Secondary | ICD-10-CM | POA: Diagnosis not present

## 2022-11-06 DIAGNOSIS — M5459 Other low back pain: Secondary | ICD-10-CM | POA: Diagnosis not present

## 2022-11-06 DIAGNOSIS — M6281 Muscle weakness (generalized): Secondary | ICD-10-CM | POA: Diagnosis not present

## 2022-11-06 DIAGNOSIS — M4316 Spondylolisthesis, lumbar region: Secondary | ICD-10-CM | POA: Diagnosis not present

## 2022-11-11 DIAGNOSIS — Z4789 Encounter for other orthopedic aftercare: Secondary | ICD-10-CM | POA: Diagnosis not present

## 2022-11-11 DIAGNOSIS — M48061 Spinal stenosis, lumbar region without neurogenic claudication: Secondary | ICD-10-CM | POA: Diagnosis not present

## 2022-11-11 DIAGNOSIS — H409 Unspecified glaucoma: Secondary | ICD-10-CM | POA: Diagnosis not present

## 2022-11-11 DIAGNOSIS — M4326 Fusion of spine, lumbar region: Secondary | ICD-10-CM | POA: Diagnosis not present

## 2022-11-11 DIAGNOSIS — N183 Chronic kidney disease, stage 3 unspecified: Secondary | ICD-10-CM | POA: Diagnosis not present

## 2022-11-11 DIAGNOSIS — E782 Mixed hyperlipidemia: Secondary | ICD-10-CM | POA: Diagnosis not present

## 2022-11-11 DIAGNOSIS — M4316 Spondylolisthesis, lumbar region: Secondary | ICD-10-CM | POA: Diagnosis not present

## 2022-11-11 DIAGNOSIS — I129 Hypertensive chronic kidney disease with stage 1 through stage 4 chronic kidney disease, or unspecified chronic kidney disease: Secondary | ICD-10-CM | POA: Diagnosis not present

## 2022-11-11 DIAGNOSIS — E1122 Type 2 diabetes mellitus with diabetic chronic kidney disease: Secondary | ICD-10-CM | POA: Diagnosis not present

## 2022-11-12 DIAGNOSIS — Z4789 Encounter for other orthopedic aftercare: Secondary | ICD-10-CM | POA: Diagnosis not present

## 2022-11-12 DIAGNOSIS — N183 Chronic kidney disease, stage 3 unspecified: Secondary | ICD-10-CM | POA: Diagnosis not present

## 2022-11-12 DIAGNOSIS — M48061 Spinal stenosis, lumbar region without neurogenic claudication: Secondary | ICD-10-CM | POA: Diagnosis not present

## 2022-11-12 DIAGNOSIS — M4316 Spondylolisthesis, lumbar region: Secondary | ICD-10-CM | POA: Diagnosis not present

## 2022-11-12 DIAGNOSIS — H409 Unspecified glaucoma: Secondary | ICD-10-CM | POA: Diagnosis not present

## 2022-11-12 DIAGNOSIS — I129 Hypertensive chronic kidney disease with stage 1 through stage 4 chronic kidney disease, or unspecified chronic kidney disease: Secondary | ICD-10-CM | POA: Diagnosis not present

## 2022-11-12 DIAGNOSIS — E1122 Type 2 diabetes mellitus with diabetic chronic kidney disease: Secondary | ICD-10-CM | POA: Diagnosis not present

## 2022-11-12 DIAGNOSIS — E782 Mixed hyperlipidemia: Secondary | ICD-10-CM | POA: Diagnosis not present

## 2022-11-12 DIAGNOSIS — M4326 Fusion of spine, lumbar region: Secondary | ICD-10-CM | POA: Diagnosis not present

## 2022-11-13 DIAGNOSIS — M4326 Fusion of spine, lumbar region: Secondary | ICD-10-CM | POA: Diagnosis not present

## 2022-11-13 DIAGNOSIS — M4316 Spondylolisthesis, lumbar region: Secondary | ICD-10-CM | POA: Diagnosis not present

## 2022-11-13 DIAGNOSIS — Z4789 Encounter for other orthopedic aftercare: Secondary | ICD-10-CM | POA: Diagnosis not present

## 2022-11-13 DIAGNOSIS — I129 Hypertensive chronic kidney disease with stage 1 through stage 4 chronic kidney disease, or unspecified chronic kidney disease: Secondary | ICD-10-CM | POA: Diagnosis not present

## 2022-11-13 DIAGNOSIS — H409 Unspecified glaucoma: Secondary | ICD-10-CM | POA: Diagnosis not present

## 2022-11-13 DIAGNOSIS — M48061 Spinal stenosis, lumbar region without neurogenic claudication: Secondary | ICD-10-CM | POA: Diagnosis not present

## 2022-11-13 DIAGNOSIS — E782 Mixed hyperlipidemia: Secondary | ICD-10-CM | POA: Diagnosis not present

## 2022-11-13 DIAGNOSIS — E1122 Type 2 diabetes mellitus with diabetic chronic kidney disease: Secondary | ICD-10-CM | POA: Diagnosis not present

## 2022-11-13 DIAGNOSIS — N183 Chronic kidney disease, stage 3 unspecified: Secondary | ICD-10-CM | POA: Diagnosis not present

## 2022-11-14 DIAGNOSIS — M5416 Radiculopathy, lumbar region: Secondary | ICD-10-CM | POA: Diagnosis not present

## 2022-11-14 DIAGNOSIS — M4316 Spondylolisthesis, lumbar region: Secondary | ICD-10-CM | POA: Diagnosis not present

## 2022-11-19 DIAGNOSIS — N1831 Chronic kidney disease, stage 3a: Secondary | ICD-10-CM | POA: Diagnosis not present

## 2022-11-19 DIAGNOSIS — I10 Essential (primary) hypertension: Secondary | ICD-10-CM | POA: Diagnosis not present

## 2022-11-19 DIAGNOSIS — M4800 Spinal stenosis, site unspecified: Secondary | ICD-10-CM | POA: Diagnosis not present

## 2022-11-19 DIAGNOSIS — M4326 Fusion of spine, lumbar region: Secondary | ICD-10-CM | POA: Diagnosis not present

## 2022-11-19 DIAGNOSIS — Z09 Encounter for follow-up examination after completed treatment for conditions other than malignant neoplasm: Secondary | ICD-10-CM | POA: Diagnosis not present

## 2022-11-19 DIAGNOSIS — E1169 Type 2 diabetes mellitus with other specified complication: Secondary | ICD-10-CM | POA: Diagnosis not present

## 2022-11-27 DIAGNOSIS — E1122 Type 2 diabetes mellitus with diabetic chronic kidney disease: Secondary | ICD-10-CM | POA: Diagnosis not present

## 2022-11-27 DIAGNOSIS — M4326 Fusion of spine, lumbar region: Secondary | ICD-10-CM | POA: Diagnosis not present

## 2022-11-27 DIAGNOSIS — E782 Mixed hyperlipidemia: Secondary | ICD-10-CM | POA: Diagnosis not present

## 2022-11-27 DIAGNOSIS — M48061 Spinal stenosis, lumbar region without neurogenic claudication: Secondary | ICD-10-CM | POA: Diagnosis not present

## 2022-11-27 DIAGNOSIS — M4316 Spondylolisthesis, lumbar region: Secondary | ICD-10-CM | POA: Diagnosis not present

## 2022-11-27 DIAGNOSIS — I129 Hypertensive chronic kidney disease with stage 1 through stage 4 chronic kidney disease, or unspecified chronic kidney disease: Secondary | ICD-10-CM | POA: Diagnosis not present

## 2022-11-27 DIAGNOSIS — Z4789 Encounter for other orthopedic aftercare: Secondary | ICD-10-CM | POA: Diagnosis not present

## 2022-11-27 DIAGNOSIS — N183 Chronic kidney disease, stage 3 unspecified: Secondary | ICD-10-CM | POA: Diagnosis not present

## 2022-11-27 DIAGNOSIS — H409 Unspecified glaucoma: Secondary | ICD-10-CM | POA: Diagnosis not present

## 2022-11-29 DIAGNOSIS — M4316 Spondylolisthesis, lumbar region: Secondary | ICD-10-CM | POA: Diagnosis not present

## 2022-11-29 DIAGNOSIS — N183 Chronic kidney disease, stage 3 unspecified: Secondary | ICD-10-CM | POA: Diagnosis not present

## 2022-11-29 DIAGNOSIS — I129 Hypertensive chronic kidney disease with stage 1 through stage 4 chronic kidney disease, or unspecified chronic kidney disease: Secondary | ICD-10-CM | POA: Diagnosis not present

## 2022-11-29 DIAGNOSIS — M4326 Fusion of spine, lumbar region: Secondary | ICD-10-CM | POA: Diagnosis not present

## 2022-11-29 DIAGNOSIS — E1122 Type 2 diabetes mellitus with diabetic chronic kidney disease: Secondary | ICD-10-CM | POA: Diagnosis not present

## 2022-11-29 DIAGNOSIS — M48061 Spinal stenosis, lumbar region without neurogenic claudication: Secondary | ICD-10-CM | POA: Diagnosis not present

## 2022-11-29 DIAGNOSIS — Z4789 Encounter for other orthopedic aftercare: Secondary | ICD-10-CM | POA: Diagnosis not present

## 2022-11-29 DIAGNOSIS — E782 Mixed hyperlipidemia: Secondary | ICD-10-CM | POA: Diagnosis not present

## 2022-11-29 DIAGNOSIS — H409 Unspecified glaucoma: Secondary | ICD-10-CM | POA: Diagnosis not present

## 2022-12-02 DIAGNOSIS — I129 Hypertensive chronic kidney disease with stage 1 through stage 4 chronic kidney disease, or unspecified chronic kidney disease: Secondary | ICD-10-CM | POA: Diagnosis not present

## 2022-12-02 DIAGNOSIS — Z4789 Encounter for other orthopedic aftercare: Secondary | ICD-10-CM | POA: Diagnosis not present

## 2022-12-02 DIAGNOSIS — M4326 Fusion of spine, lumbar region: Secondary | ICD-10-CM | POA: Diagnosis not present

## 2022-12-02 DIAGNOSIS — M4316 Spondylolisthesis, lumbar region: Secondary | ICD-10-CM | POA: Diagnosis not present

## 2022-12-02 DIAGNOSIS — H409 Unspecified glaucoma: Secondary | ICD-10-CM | POA: Diagnosis not present

## 2022-12-02 DIAGNOSIS — E1122 Type 2 diabetes mellitus with diabetic chronic kidney disease: Secondary | ICD-10-CM | POA: Diagnosis not present

## 2022-12-02 DIAGNOSIS — E782 Mixed hyperlipidemia: Secondary | ICD-10-CM | POA: Diagnosis not present

## 2022-12-02 DIAGNOSIS — N183 Chronic kidney disease, stage 3 unspecified: Secondary | ICD-10-CM | POA: Diagnosis not present

## 2022-12-02 DIAGNOSIS — M48061 Spinal stenosis, lumbar region without neurogenic claudication: Secondary | ICD-10-CM | POA: Diagnosis not present

## 2022-12-04 DIAGNOSIS — M4316 Spondylolisthesis, lumbar region: Secondary | ICD-10-CM | POA: Diagnosis not present

## 2022-12-04 DIAGNOSIS — E1122 Type 2 diabetes mellitus with diabetic chronic kidney disease: Secondary | ICD-10-CM | POA: Diagnosis not present

## 2022-12-04 DIAGNOSIS — N183 Chronic kidney disease, stage 3 unspecified: Secondary | ICD-10-CM | POA: Diagnosis not present

## 2022-12-04 DIAGNOSIS — E782 Mixed hyperlipidemia: Secondary | ICD-10-CM | POA: Diagnosis not present

## 2022-12-04 DIAGNOSIS — M48061 Spinal stenosis, lumbar region without neurogenic claudication: Secondary | ICD-10-CM | POA: Diagnosis not present

## 2022-12-04 DIAGNOSIS — I129 Hypertensive chronic kidney disease with stage 1 through stage 4 chronic kidney disease, or unspecified chronic kidney disease: Secondary | ICD-10-CM | POA: Diagnosis not present

## 2022-12-04 DIAGNOSIS — M4326 Fusion of spine, lumbar region: Secondary | ICD-10-CM | POA: Diagnosis not present

## 2022-12-04 DIAGNOSIS — H409 Unspecified glaucoma: Secondary | ICD-10-CM | POA: Diagnosis not present

## 2022-12-04 DIAGNOSIS — Z4789 Encounter for other orthopedic aftercare: Secondary | ICD-10-CM | POA: Diagnosis not present

## 2022-12-05 DIAGNOSIS — I129 Hypertensive chronic kidney disease with stage 1 through stage 4 chronic kidney disease, or unspecified chronic kidney disease: Secondary | ICD-10-CM | POA: Diagnosis not present

## 2022-12-05 DIAGNOSIS — E1122 Type 2 diabetes mellitus with diabetic chronic kidney disease: Secondary | ICD-10-CM | POA: Diagnosis not present

## 2022-12-05 DIAGNOSIS — M4316 Spondylolisthesis, lumbar region: Secondary | ICD-10-CM | POA: Diagnosis not present

## 2022-12-05 DIAGNOSIS — Z4789 Encounter for other orthopedic aftercare: Secondary | ICD-10-CM | POA: Diagnosis not present

## 2022-12-05 DIAGNOSIS — M48061 Spinal stenosis, lumbar region without neurogenic claudication: Secondary | ICD-10-CM | POA: Diagnosis not present

## 2022-12-05 DIAGNOSIS — H409 Unspecified glaucoma: Secondary | ICD-10-CM | POA: Diagnosis not present

## 2022-12-05 DIAGNOSIS — M4326 Fusion of spine, lumbar region: Secondary | ICD-10-CM | POA: Diagnosis not present

## 2022-12-05 DIAGNOSIS — E782 Mixed hyperlipidemia: Secondary | ICD-10-CM | POA: Diagnosis not present

## 2022-12-05 DIAGNOSIS — N183 Chronic kidney disease, stage 3 unspecified: Secondary | ICD-10-CM | POA: Diagnosis not present

## 2022-12-11 DIAGNOSIS — H409 Unspecified glaucoma: Secondary | ICD-10-CM | POA: Diagnosis not present

## 2022-12-11 DIAGNOSIS — I129 Hypertensive chronic kidney disease with stage 1 through stage 4 chronic kidney disease, or unspecified chronic kidney disease: Secondary | ICD-10-CM | POA: Diagnosis not present

## 2022-12-11 DIAGNOSIS — E1122 Type 2 diabetes mellitus with diabetic chronic kidney disease: Secondary | ICD-10-CM | POA: Diagnosis not present

## 2022-12-11 DIAGNOSIS — N183 Chronic kidney disease, stage 3 unspecified: Secondary | ICD-10-CM | POA: Diagnosis not present

## 2022-12-11 DIAGNOSIS — E782 Mixed hyperlipidemia: Secondary | ICD-10-CM | POA: Diagnosis not present

## 2022-12-11 DIAGNOSIS — M48061 Spinal stenosis, lumbar region without neurogenic claudication: Secondary | ICD-10-CM | POA: Diagnosis not present

## 2022-12-11 DIAGNOSIS — M4316 Spondylolisthesis, lumbar region: Secondary | ICD-10-CM | POA: Diagnosis not present

## 2022-12-11 DIAGNOSIS — M4326 Fusion of spine, lumbar region: Secondary | ICD-10-CM | POA: Diagnosis not present

## 2022-12-11 DIAGNOSIS — Z4789 Encounter for other orthopedic aftercare: Secondary | ICD-10-CM | POA: Diagnosis not present

## 2022-12-20 DIAGNOSIS — M4316 Spondylolisthesis, lumbar region: Secondary | ICD-10-CM | POA: Diagnosis not present

## 2022-12-20 DIAGNOSIS — M4326 Fusion of spine, lumbar region: Secondary | ICD-10-CM | POA: Diagnosis not present

## 2022-12-20 DIAGNOSIS — M48061 Spinal stenosis, lumbar region without neurogenic claudication: Secondary | ICD-10-CM | POA: Diagnosis not present

## 2022-12-20 DIAGNOSIS — E1122 Type 2 diabetes mellitus with diabetic chronic kidney disease: Secondary | ICD-10-CM | POA: Diagnosis not present

## 2022-12-20 DIAGNOSIS — E782 Mixed hyperlipidemia: Secondary | ICD-10-CM | POA: Diagnosis not present

## 2022-12-20 DIAGNOSIS — H409 Unspecified glaucoma: Secondary | ICD-10-CM | POA: Diagnosis not present

## 2022-12-20 DIAGNOSIS — N183 Chronic kidney disease, stage 3 unspecified: Secondary | ICD-10-CM | POA: Diagnosis not present

## 2022-12-20 DIAGNOSIS — I129 Hypertensive chronic kidney disease with stage 1 through stage 4 chronic kidney disease, or unspecified chronic kidney disease: Secondary | ICD-10-CM | POA: Diagnosis not present

## 2022-12-20 DIAGNOSIS — Z4789 Encounter for other orthopedic aftercare: Secondary | ICD-10-CM | POA: Diagnosis not present

## 2022-12-24 ENCOUNTER — Encounter: Payer: Self-pay | Admitting: Podiatry

## 2022-12-24 ENCOUNTER — Ambulatory Visit: Payer: Medicare PPO | Admitting: Podiatry

## 2022-12-24 VITALS — BP 168/75

## 2022-12-24 DIAGNOSIS — E119 Type 2 diabetes mellitus without complications: Secondary | ICD-10-CM

## 2022-12-24 DIAGNOSIS — B351 Tinea unguium: Secondary | ICD-10-CM | POA: Diagnosis not present

## 2022-12-24 DIAGNOSIS — E1169 Type 2 diabetes mellitus with other specified complication: Secondary | ICD-10-CM | POA: Diagnosis not present

## 2022-12-24 DIAGNOSIS — M79674 Pain in right toe(s): Secondary | ICD-10-CM | POA: Diagnosis not present

## 2022-12-24 DIAGNOSIS — M79675 Pain in left toe(s): Secondary | ICD-10-CM | POA: Diagnosis not present

## 2022-12-24 NOTE — Patient Instructions (Signed)

## 2022-12-24 NOTE — Progress Notes (Signed)
Subjective: Kristen Alvarado presents today for diabetic foot evaluation. Chief Complaint  Patient presents with   Nail Problem    [facility] BS-do not check A1C-do not know PCP-Brake PCP VST-11/2022   Patient relates 1 year h/o diabetes.  Patient denies any h/o foot wounds.  Patient endorses symptoms of foot numbness, but feels this is related to her sciatica.  Risk factors: diabetes, HTN, CKD, hypercholesterolemia.  PCP is Kristen Pilon, FNP.  Past Medical History:  Diagnosis Date   Diabetes mellitus without complication    type 2   Hypertension     Patient Active Problem List   Diagnosis Date Noted   S/P lumbar fusion 10/21/2022   Body mass index (BMI) 40.0-44.9, adult 10/08/2021   Hypercholesterolemia 10/08/2021   Essential hypertension 10/08/2021   Hypokalemia 10/08/2021   Morbid obesity 10/08/2021   Prediabetes 10/08/2021   Stage 3a chronic kidney disease 10/08/2021   Type 2 diabetes mellitus with other specified complication 10/08/2021    Past Surgical History:  Procedure Laterality Date   ABDOMINAL HYSTERECTOMY     APPENDECTOMY     TUBAL LIGATION      Current Outpatient Medications on File Prior to Visit  Medication Sig Dispense Refill   atorvastatin (LIPITOR) 20 MG tablet Take 20 mg by mouth daily.     cholecalciferol (VITAMIN D3) 25 MCG (1000 UNIT) tablet Take 1,000 Units by mouth daily.     cloNIDine (CATAPRES) 0.2 MG tablet Take 0.2 mg by mouth 2 (two) times daily.     hydrochlorothiazide (HYDRODIURIL) 25 MG tablet Take 25 mg by mouth daily.     HYDROcodone-acetaminophen (NORCO/VICODIN) 5-325 MG tablet Take 1 tablet by mouth every 4 (four) hours as needed for moderate pain. 30 tablet 0   lisinopril (ZESTRIL) 40 MG tablet Take 40 mg by mouth daily.     metFORMIN (GLUCOPHAGE-XR) 500 MG 24 hr tablet Take 500 mg by mouth daily with breakfast.     methocarbamol (ROBAXIN) 500 MG tablet Take 1 tablet (500 mg total) by mouth every 6 (six) hours as needed for  muscle spasms. 45 tablet 0   Multiple Vitamin (MULTIVITAMIN WITH MINERALS) TABS tablet Take 1 tablet by mouth daily.     timolol (TIMOPTIC) 0.5 % ophthalmic solution Place 1 drop into both eyes daily.     No current facility-administered medications on file prior to visit.     No Known Allergies  Social History   Occupational History   Not on file  Tobacco Use   Smoking status: Never   Smokeless tobacco: Never  Vaping Use   Vaping Use: Never used  Substance and Sexual Activity   Alcohol use: No   Drug use: No   Sexual activity: Not on file    History reviewed. No pertinent family history.   There is no immunization history on file for this patient.  Objective: Vitals:   12/24/22 0919  BP: (!) 168/75   Kristen Alvarado is a pleasant 67 y.o. female morbidly obese in NAD. AAO X 3.  Vascular Examination: Capillary refill time immediate b/l. Vascular status intact b/l with palpable pedal pulses. Pedal hair present b/l. No edema. No pain with calf compression b/l. Skin temperature gradient WNL b/l. No ischemia or gangrene noted b/l LE. No cyanosis or clubbing noted b/l LE.  Neurological Examination: Sensation grossly intact b/l with 10 gram monofilament. Vibratory sensation intact b/l. Protective sensation intact 5/5 intact bilaterally with 10g monofilament b/l. Vibratory sensation intact b/l. Proprioception intact bilaterally.  Dermatological Examination: Pedal skin with normal turgor, texture and tone b/l.  No open wounds. No interdigital macerations.   Toenails 1-5 b/l thick, discolored, elongated with subungual debris and pain on dorsal palpation.   Pedal integument with normal turgor, texture and tone b/l LE. No open wounds b/l. No interdigital macerations b/l. Toenails 1-5 b/l elongated, thickened, discolored with subungual debris. +Tenderness with dorsal palpation of nailplates. No hyperkeratotic or porokeratotic lesions present.  Musculoskeletal Examination: Muscle  strength 5/5 to all lower extremity muscle groups bilaterally. No pain, crepitus or joint limitation noted with ROM b/l lower extremities.  Radiographs: None  Last A1c:      Latest Ref Rng & Units 10/10/2022    9:45 AM  Hemoglobin A1C  Hemoglobin-A1c 4.8 - 5.6 % 6.4    Footwear Assessment: Does the patient wear appropriate shoes? Yes. Does the patient need inserts/orthotics? No.  Lab Results  Component Value Date   HGBA1C 6.4 (H) 10/10/2022   Assessment: No diagnosis found.   ADA Risk Categorization: Low Risk:  Patient has all of the following: Intact protective sensation No prior foot ulcer  No severe deformity Pedal pulses present  Plan: -Patient was evaluated and treated. All patient's and/or POA's questions/concerns answered on today's visit. -Diabetic foot examination performed today. -Patient to continue soft, supportive shoe gear daily. -Toenails 1-5 b/l were debrided in length and girth with sterile nail nippers and dremel without iatrogenic bleeding.  -Patient/POA to call should there be question/concern in the interim.  Return in about 3 months (around 03/25/2023).  Freddie Breech, DPM

## 2023-01-28 DIAGNOSIS — Z6839 Body mass index (BMI) 39.0-39.9, adult: Secondary | ICD-10-CM | POA: Diagnosis not present

## 2023-01-28 DIAGNOSIS — Z1231 Encounter for screening mammogram for malignant neoplasm of breast: Secondary | ICD-10-CM | POA: Diagnosis not present

## 2023-01-28 DIAGNOSIS — Z01419 Encounter for gynecological examination (general) (routine) without abnormal findings: Secondary | ICD-10-CM | POA: Diagnosis not present

## 2023-02-10 ENCOUNTER — Other Ambulatory Visit: Payer: Self-pay | Admitting: Gastroenterology

## 2023-02-10 DIAGNOSIS — Z800 Family history of malignant neoplasm of digestive organs: Secondary | ICD-10-CM

## 2023-02-20 DIAGNOSIS — M4316 Spondylolisthesis, lumbar region: Secondary | ICD-10-CM | POA: Diagnosis not present

## 2023-02-20 DIAGNOSIS — Z6839 Body mass index (BMI) 39.0-39.9, adult: Secondary | ICD-10-CM | POA: Diagnosis not present

## 2023-03-31 ENCOUNTER — Encounter: Payer: Self-pay | Admitting: Gastroenterology

## 2023-04-01 DIAGNOSIS — E7800 Pure hypercholesterolemia, unspecified: Secondary | ICD-10-CM | POA: Diagnosis not present

## 2023-04-01 DIAGNOSIS — I10 Essential (primary) hypertension: Secondary | ICD-10-CM | POA: Diagnosis not present

## 2023-04-01 DIAGNOSIS — N1831 Chronic kidney disease, stage 3a: Secondary | ICD-10-CM | POA: Diagnosis not present

## 2023-04-01 DIAGNOSIS — E876 Hypokalemia: Secondary | ICD-10-CM | POA: Diagnosis not present

## 2023-04-01 DIAGNOSIS — E1169 Type 2 diabetes mellitus with other specified complication: Secondary | ICD-10-CM | POA: Diagnosis not present

## 2023-04-01 DIAGNOSIS — E1122 Type 2 diabetes mellitus with diabetic chronic kidney disease: Secondary | ICD-10-CM | POA: Diagnosis not present

## 2023-04-02 ENCOUNTER — Ambulatory Visit
Admission: RE | Admit: 2023-04-02 | Discharge: 2023-04-02 | Disposition: A | Discharge status: Home / Self Care | Payer: Medicare PPO | Source: Ambulatory Visit | Attending: Gastroenterology | Admitting: Gastroenterology

## 2023-04-02 DIAGNOSIS — Z800 Family history of malignant neoplasm of digestive organs: Secondary | ICD-10-CM

## 2023-04-03 DIAGNOSIS — H40053 Ocular hypertension, bilateral: Secondary | ICD-10-CM | POA: Diagnosis not present

## 2023-04-03 DIAGNOSIS — H25813 Combined forms of age-related cataract, bilateral: Secondary | ICD-10-CM | POA: Diagnosis not present

## 2023-04-03 DIAGNOSIS — H40013 Open angle with borderline findings, low risk, bilateral: Secondary | ICD-10-CM | POA: Diagnosis not present

## 2023-04-03 DIAGNOSIS — H35033 Hypertensive retinopathy, bilateral: Secondary | ICD-10-CM | POA: Diagnosis not present

## 2023-04-06 ENCOUNTER — Ambulatory Visit
Admission: RE | Admit: 2023-04-06 | Discharge: 2023-04-06 | Disposition: A | Discharge status: Home / Self Care | Payer: Medicare PPO | Source: Ambulatory Visit | Attending: Gastroenterology | Admitting: Gastroenterology

## 2023-04-06 DIAGNOSIS — Z800 Family history of malignant neoplasm of digestive organs: Secondary | ICD-10-CM | POA: Diagnosis not present

## 2023-04-06 DIAGNOSIS — K449 Diaphragmatic hernia without obstruction or gangrene: Secondary | ICD-10-CM | POA: Diagnosis not present

## 2023-04-06 DIAGNOSIS — D1803 Hemangioma of intra-abdominal structures: Secondary | ICD-10-CM | POA: Diagnosis not present

## 2023-04-06 MED ORDER — GADOPICLENOL 0.5 MMOL/ML IV SOLN
7.0000 mL | Freq: Once | INTRAVENOUS | Status: AC | PRN
  Administered 2023-04-06: 7 mL via INTRAVENOUS

## 2023-05-06 ENCOUNTER — Ambulatory Visit (INDEPENDENT_AMBULATORY_CARE_PROVIDER_SITE_OTHER): Payer: Medicare PPO | Admitting: Podiatry

## 2023-05-06 DIAGNOSIS — Z91199 Patient's noncompliance with other medical treatment and regimen due to unspecified reason: Secondary | ICD-10-CM

## 2023-05-06 NOTE — Progress Notes (Signed)
1. No-show for appointment     

## 2023-06-02 ENCOUNTER — Encounter: Payer: Self-pay | Admitting: Podiatry

## 2023-06-02 ENCOUNTER — Ambulatory Visit (INDEPENDENT_AMBULATORY_CARE_PROVIDER_SITE_OTHER): Payer: Medicare PPO | Admitting: Podiatry

## 2023-06-02 VITALS — BP 201/88 | HR 61

## 2023-06-02 DIAGNOSIS — M79674 Pain in right toe(s): Secondary | ICD-10-CM

## 2023-06-02 DIAGNOSIS — E1169 Type 2 diabetes mellitus with other specified complication: Secondary | ICD-10-CM

## 2023-06-02 DIAGNOSIS — M79675 Pain in left toe(s): Secondary | ICD-10-CM | POA: Diagnosis not present

## 2023-06-02 DIAGNOSIS — B351 Tinea unguium: Secondary | ICD-10-CM

## 2023-06-03 ENCOUNTER — Encounter: Payer: Self-pay | Admitting: Podiatry

## 2023-06-03 NOTE — Progress Notes (Signed)
Subjective:  Patient ID: Kristen Alvarado, female    DOB: [birth year],  MRN: [id number]  Kristen Alvarado presents to clinic today for: preventative diabetic foot care and painful thick toenails that are difficult to trim. Pain interferes with ambulation. Aggravating factors include wearing enclosed shoe gear. Pain is relieved with periodic professional debridement.  Chief Complaint  Patient presents with   Diabetes    "Clip my toenails."   PCP is Soundra Pilon, FNP.  No Known Allergies  Review of Systems: Negative except as noted in the HPI.  Objective: No changes noted in today's physical examination. Vitals:   06/02/23 0938 06/02/23 1024  BP: (!) 229/91 (!) 201/88  Pulse: 62 61    Kristen Alvarado is a pleasant 67 y.o. female in NAD. AAO x 3.  Vascular Examination: Capillary refill time <3 seconds b/l LE. Palpable pedal pulses b/l LE. Digital hair present b/l. No pedal edema b/l. Skin temperature gradient WNL b/l. No varicosities b/l. Marland Kitchen  Dermatological Examination: Pedal skin with normal turgor, texture and tone b/l. No open wounds. No interdigital macerations b/l. Toenails 1-5 b/l thickened, discolored, dystrophic with subungual debris. There is pain on palpation to dorsal aspect of nailplates. No corns, calluses nor porokeratotic lesions noted..  Neurological Examination: Protective sensation intact with 10 gram monofilament b/l LE. Vibratory sensation intact b/l LE.   Musculoskeletal Examination: Normal muscle strength 5/5 to all lower extremity muscle groups bilaterally. No pain, crepitus or joint limitation noted with ROM b/l LE. No gross bony pedal deformities b/l. Patient ambulates independently without assistive aids.     Latest Ref Rng & Units 10/10/2022    9:45 AM  Hemoglobin A1C  Hemoglobin-A1c 4.8 - 5.6 % 6.4    Assessment/Plan: 1. Pain due to onychomycosis of toenails of both feet   2. Type 2 diabetes mellitus with other specified complication, without  long-term current use of insulin (HCC)    -Consent given for treatment as described below: -Examined patient. -Discussed blood pressure reading with patient. Patient is asymptomatic on today's visit. Patient/POA/Caregiver/Family advised to discuss with PCP/Cardiologist. Patient/POA/Family/Caregiver related understanding. -Continue supportive shoe gear daily. -Mycotic toenails 1-5 bilaterally were debrided in length and girth with sterile nail nippers and dremel without incident. -Patient/POA to call should there be question/concern in the interim.   Return in about 3 months (around 09/01/2023).  Freddie Breech, DPM

## 2023-09-17 ENCOUNTER — Encounter: Payer: Self-pay | Admitting: Podiatry

## 2023-09-17 ENCOUNTER — Ambulatory Visit (INDEPENDENT_AMBULATORY_CARE_PROVIDER_SITE_OTHER): Payer: Medicare PPO | Admitting: Podiatry

## 2023-09-17 DIAGNOSIS — B351 Tinea unguium: Secondary | ICD-10-CM

## 2023-09-17 DIAGNOSIS — M79675 Pain in left toe(s): Secondary | ICD-10-CM | POA: Diagnosis not present

## 2023-09-17 DIAGNOSIS — M79674 Pain in right toe(s): Secondary | ICD-10-CM | POA: Diagnosis not present

## 2023-09-17 DIAGNOSIS — E1169 Type 2 diabetes mellitus with other specified complication: Secondary | ICD-10-CM | POA: Diagnosis not present

## 2023-09-23 NOTE — Progress Notes (Signed)
  Subjective:  Patient ID: Kristen Alvarado, female    DOB: 14-Feb-1956,  MRN: [id number]  68 y.o. female presents to clinic with  preventative diabetic foot care and painful elongated mycotic toenails 1-5 bilaterally which are tender when wearing enclosed shoe gear. Pain is relieved with periodic professional debridement.  Chief Complaint  Patient presents with   [facility]    RM#17 [facility] Patient states has appt with pcp for diabetes follow up this month.Patient dos not check blood sugars at home in the am .   New problem(s): None   PCP is Marvene Prentice SAUNDERS, FNP.LOV 04/01/2023.  No Known Allergies  Review of Systems: Negative except as noted in the HPI.   Objective:  Kristen Alvarado is a pleasant 68 y.o. female in NAD.SABRA AAO x 3.  Vascular Examination: Vascular status intact b/l with palpable pedal pulses. CFT <3 seconds b/l LE. Pedal hair present. No varicosities noted.  Neurological Examination: Sensation grossly intact b/l with 10 gram monofilament.  Dermatological Examination: Pedal skin with normal turgor, texture and tone b/l. Toenails 1-5 b/l thick, discolored, elongated with subungual debris and pain on dorsal palpation. No hyperkeratotic lesions noted b/l.   Musculoskeletal Examination: Muscle strength 5/5 to b/l LE. No pain, crepitus or joint limitation noted with ROM bilateral LE. No gross bony deformities bilaterally.  Radiographs: None  Last A1c:      Latest Ref Rng & Units 10/10/2022    9:45 AM  Hemoglobin A1C  Hemoglobin-A1c 4.8 - 5.6 % 6.4      Assessment:   1. Pain due to onychomycosis of toenails of both feet   2. Type 2 diabetes mellitus with other specified complication, without long-term current use of insulin  [facility])     Plan:  Patient was evaluated and treated. All patient's and/or POA's questions/concerns addressed on today's visit. Toenails 1-5 debrided in length and girth without incident. Continue soft, supportive shoe gear daily. Report any pedal injuries  to medical professional. Call office if there are any questions/concerns. -Continue foot and shoe inspections daily. Monitor blood glucose per PCP/Endocrinologist's recommendations. -Patient/POA to call should there be question/concern in the interim.  Return in about 3 months (around 12/16/2023).  Kristen Alvarado, DPM       LOCATION: [address] [place] [address]                   Office [phone]   [facility] LOCATION: [address] [place], [place] [address] Office [phone]

## 2023-11-12 DIAGNOSIS — H35033 Hypertensive retinopathy, bilateral: Secondary | ICD-10-CM | POA: Diagnosis not present

## 2023-11-12 DIAGNOSIS — E1169 Type 2 diabetes mellitus with other specified complication: Secondary | ICD-10-CM | POA: Diagnosis not present

## 2023-11-12 DIAGNOSIS — Z0000 Encounter for general adult medical examination without abnormal findings: Secondary | ICD-10-CM | POA: Diagnosis not present

## 2023-11-12 DIAGNOSIS — Z6838 Body mass index (BMI) 38.0-38.9, adult: Secondary | ICD-10-CM | POA: Diagnosis not present

## 2023-11-12 DIAGNOSIS — I10 Essential (primary) hypertension: Secondary | ICD-10-CM | POA: Diagnosis not present

## 2023-11-12 DIAGNOSIS — N1831 Chronic kidney disease, stage 3a: Secondary | ICD-10-CM | POA: Diagnosis not present

## 2023-11-12 DIAGNOSIS — E7800 Pure hypercholesterolemia, unspecified: Secondary | ICD-10-CM | POA: Diagnosis not present

## 2023-11-27 DIAGNOSIS — H40053 Ocular hypertension, bilateral: Secondary | ICD-10-CM | POA: Diagnosis not present

## 2023-11-27 DIAGNOSIS — H40013 Open angle with borderline findings, low risk, bilateral: Secondary | ICD-10-CM | POA: Diagnosis not present

## 2024-01-07 ENCOUNTER — Ambulatory Visit: Payer: Medicare PPO | Admitting: Podiatry

## 2024-01-07 ENCOUNTER — Ambulatory Visit: Admitting: Podiatry

## 2024-01-07 ENCOUNTER — Encounter: Payer: Self-pay | Admitting: Podiatry

## 2024-01-07 DIAGNOSIS — E1169 Type 2 diabetes mellitus with other specified complication: Secondary | ICD-10-CM

## 2024-01-07 DIAGNOSIS — M79675 Pain in left toe(s): Secondary | ICD-10-CM | POA: Diagnosis not present

## 2024-01-07 DIAGNOSIS — B351 Tinea unguium: Secondary | ICD-10-CM | POA: Diagnosis not present

## 2024-01-07 DIAGNOSIS — M79674 Pain in right toe(s): Secondary | ICD-10-CM | POA: Diagnosis not present

## 2024-01-07 NOTE — Progress Notes (Signed)

## 2024-01-08 NOTE — Progress Notes (Signed)
 1. Failure to attend appointment with reason given    Provider out of office. Patient rescheduled.

## 2024-02-05 DIAGNOSIS — Z01419 Encounter for gynecological examination (general) (routine) without abnormal findings: Secondary | ICD-10-CM | POA: Diagnosis not present

## 2024-02-05 DIAGNOSIS — Z6841 Body Mass Index (BMI) 40.0 and over, adult: Secondary | ICD-10-CM | POA: Diagnosis not present

## 2024-02-05 DIAGNOSIS — Z1231 Encounter for screening mammogram for malignant neoplasm of breast: Secondary | ICD-10-CM | POA: Diagnosis not present

## 2024-02-11 ENCOUNTER — Other Ambulatory Visit: Payer: Self-pay | Admitting: Obstetrics and Gynecology

## 2024-02-11 DIAGNOSIS — R928 Other abnormal and inconclusive findings on diagnostic imaging of breast: Secondary | ICD-10-CM

## 2024-02-25 ENCOUNTER — Ambulatory Visit
Admission: RE | Admit: 2024-02-25 | Discharge: 2024-02-25 | Disposition: A | Discharge status: Home / Self Care | Source: Ambulatory Visit | Attending: Obstetrics and Gynecology | Admitting: Obstetrics and Gynecology

## 2024-02-25 ENCOUNTER — Other Ambulatory Visit: Payer: Self-pay | Admitting: Obstetrics and Gynecology

## 2024-02-25 DIAGNOSIS — R928 Other abnormal and inconclusive findings on diagnostic imaging of breast: Secondary | ICD-10-CM | POA: Diagnosis not present

## 2024-02-25 DIAGNOSIS — N6489 Other specified disorders of breast: Secondary | ICD-10-CM

## 2024-04-01 DIAGNOSIS — E1169 Type 2 diabetes mellitus with other specified complication: Secondary | ICD-10-CM | POA: Diagnosis not present

## 2024-04-01 DIAGNOSIS — I10 Essential (primary) hypertension: Secondary | ICD-10-CM | POA: Diagnosis not present

## 2024-04-01 DIAGNOSIS — N1831 Chronic kidney disease, stage 3a: Secondary | ICD-10-CM | POA: Diagnosis not present

## 2024-04-01 DIAGNOSIS — E7800 Pure hypercholesterolemia, unspecified: Secondary | ICD-10-CM | POA: Diagnosis not present

## 2024-04-20 ENCOUNTER — Ambulatory Visit: Admitting: Podiatry

## 2024-04-20 ENCOUNTER — Encounter: Payer: Self-pay | Admitting: Podiatry

## 2024-04-20 DIAGNOSIS — B351 Tinea unguium: Secondary | ICD-10-CM

## 2024-04-20 DIAGNOSIS — E1169 Type 2 diabetes mellitus with other specified complication: Secondary | ICD-10-CM | POA: Diagnosis not present

## 2024-04-20 DIAGNOSIS — M79675 Pain in left toe(s): Secondary | ICD-10-CM | POA: Diagnosis not present

## 2024-04-20 DIAGNOSIS — M79674 Pain in right toe(s): Secondary | ICD-10-CM

## 2024-04-20 DIAGNOSIS — L84 Corns and callosities: Secondary | ICD-10-CM | POA: Diagnosis not present

## 2024-04-25 NOTE — Progress Notes (Signed)
  Subjective:  Patient ID: Kristen Alvarado, female    DOB: Jan 02, 1956,  MRN: [id number]  Kristen Alvarado presents to clinic today for preventative diabetic foot care and callus(es) of both feet and painful mycotic toenails that are difficult to trim. Painful toenails interfere with ambulation. Aggravating factors include wearing enclosed shoe gear. Pain is relieved with periodic professional debridement. Painful calluses are aggravated when weightbearing with and without shoegear. Pain is relieved with periodic professional debridement.  Chief Complaint  Patient presents with   [facility]    Rm16 Diabetic foot care Dr. Marvene last visit July 2025/A1c 6.4   New problem(s): None.   PCP is Marvene Prentice SAUNDERS, FNP.  No Known Allergies  Review of Systems: Negative except as noted in the HPI.  Objective: No changes noted in today's physical examination. There were no vitals filed for this visit. Kristen Alvarado is a pleasant 68 y.o. female in NAD. AAO x 3.  Vascular Examination: Capillary refill time immediate b/l. Vascular status intact b/l with palpable pedal pulses. Pedal hair present b/l. No pain with calf compression b/l. Skin temperature gradient WNL b/l. No cyanosis or clubbing b/l. No ischemia or gangrene noted b/l.   Neurological Examination: Sensation grossly intact b/l with 10 gram monofilament.  Dermatological Examination: Pedal skin with normal turgor, texture and tone b/l.  No open wounds. No interdigital macerations.   Toenails 1-5 b/l thick, discolored, elongated with subungual debris and pain on dorsal palpation.   Hyperkeratotic lesion(s) submet head 5 b/l.  No erythema, no edema, no drainage, no fluctuance.  Musculoskeletal Examination: Muscle strength 5/5 to all lower extremity muscle groups bilaterally. Hammertoe(s) b/l 5th toes.. No pain, crepitus or joint limitation noted with ROM b/l LE.  Patient ambulates independently without assistive aids.  Radiographs:  None  Assessment/Plan: 1. Pain due to onychomycosis of toenails of both feet   2. Callus   3. Type 2 diabetes mellitus with other specified complication, without long-term current use of insulin  [facility])    Consent given for treatment. Patient examined. All patient's and/or POA's questions/concerns addressed on today's visit. Mycotic toenails 1-5 debrided in length and girth without incident. Callus(es) submet head 5 b/l pared with sharp debridement without incident. Continue foot and shoe inspections daily. Monitor blood glucose per PCP/Endocrinologist's recommendations.Continue soft, supportive shoe gear daily. Report any pedal injuries to medical professional. Call office if there are any quesitons/concerns. -Patient/POA to call should there be question/concern in the interim.   Return in about 3 months (around 07/21/2024).  Kristen Alvarado, DPM      [place] LOCATION: [address], [place] [address]                   Office [phone]   [facility] LOCATION: [address] [place], [place] [address] Office [phone]

## 2024-05-31 DIAGNOSIS — H524 Presbyopia: Secondary | ICD-10-CM | POA: Diagnosis not present

## 2024-05-31 DIAGNOSIS — H25813 Combined forms of age-related cataract, bilateral: Secondary | ICD-10-CM | POA: Diagnosis not present

## 2024-05-31 DIAGNOSIS — H40053 Ocular hypertension, bilateral: Secondary | ICD-10-CM | POA: Diagnosis not present

## 2024-05-31 DIAGNOSIS — H40012 Open angle with borderline findings, low risk, left eye: Secondary | ICD-10-CM | POA: Diagnosis not present

## 2024-05-31 DIAGNOSIS — H40013 Open angle with borderline findings, low risk, bilateral: Secondary | ICD-10-CM | POA: Diagnosis not present

## 2024-07-05 DIAGNOSIS — E7800 Pure hypercholesterolemia, unspecified: Secondary | ICD-10-CM | POA: Diagnosis not present

## 2024-07-05 DIAGNOSIS — E1169 Type 2 diabetes mellitus with other specified complication: Secondary | ICD-10-CM | POA: Diagnosis not present

## 2024-07-05 DIAGNOSIS — Z6841 Body Mass Index (BMI) 40.0 and over, adult: Secondary | ICD-10-CM | POA: Diagnosis not present

## 2024-07-05 DIAGNOSIS — M25561 Pain in right knee: Secondary | ICD-10-CM | POA: Diagnosis not present

## 2024-07-05 DIAGNOSIS — N1831 Chronic kidney disease, stage 3a: Secondary | ICD-10-CM | POA: Diagnosis not present

## 2024-07-05 DIAGNOSIS — I10 Essential (primary) hypertension: Secondary | ICD-10-CM | POA: Diagnosis not present

## 2024-07-05 DIAGNOSIS — Z23 Encounter for immunization: Secondary | ICD-10-CM | POA: Diagnosis not present

## 2024-07-05 DIAGNOSIS — D649 Anemia, unspecified: Secondary | ICD-10-CM | POA: Diagnosis not present

## 2024-07-08 DIAGNOSIS — M1711 Unilateral primary osteoarthritis, right knee: Secondary | ICD-10-CM | POA: Diagnosis not present

## 2024-07-08 DIAGNOSIS — M25561 Pain in right knee: Secondary | ICD-10-CM | POA: Diagnosis not present

## 2024-07-28 ENCOUNTER — Encounter: Payer: Self-pay | Admitting: Podiatry

## 2024-07-28 ENCOUNTER — Ambulatory Visit: Admitting: Podiatry

## 2024-07-28 DIAGNOSIS — M79675 Pain in left toe(s): Secondary | ICD-10-CM

## 2024-07-28 DIAGNOSIS — M2041 Other hammer toe(s) (acquired), right foot: Secondary | ICD-10-CM

## 2024-07-28 DIAGNOSIS — B351 Tinea unguium: Secondary | ICD-10-CM | POA: Diagnosis not present

## 2024-07-28 DIAGNOSIS — L84 Corns and callosities: Secondary | ICD-10-CM | POA: Diagnosis not present

## 2024-07-28 DIAGNOSIS — M79674 Pain in right toe(s): Secondary | ICD-10-CM

## 2024-07-28 DIAGNOSIS — Z0189 Encounter for other specified special examinations: Secondary | ICD-10-CM | POA: Diagnosis not present

## 2024-07-28 DIAGNOSIS — E1169 Type 2 diabetes mellitus with other specified complication: Secondary | ICD-10-CM

## 2024-07-28 DIAGNOSIS — M2042 Other hammer toe(s) (acquired), left foot: Secondary | ICD-10-CM

## 2024-07-28 DIAGNOSIS — E119 Type 2 diabetes mellitus without complications: Secondary | ICD-10-CM | POA: Diagnosis not present

## 2024-08-06 NOTE — Progress Notes (Signed)
  Subjective:  Patient ID: Kristen Alvarado, female    DOB: 17-Aug-1956,  MRN: [id number]  Kristen Alvarado presents to clinic today for for annual diabetic foot examination and callus(es) b/l feet and painful mycotic toenails that are difficult to trim. Painful toenails interfere with ambulation. Aggravating factors include wearing enclosed shoe gear. Pain is relieved with periodic professional debridement. Painful calluses are aggravated when weightbearing with and without shoegear. Pain is relieved with periodic professional debridement.  Chief Complaint  Patient presents with   Diabetes    [facility] NIDDM A1C 6.4 Toenail trim. LOV with PCP 07/07/24.   New problem(s): None.   PCP is Marvene Prentice SAUNDERS, FNP.  No Known Allergies  Review of Systems: Negative except as noted in the HPI.  Objective: No changes noted in today's physical examination. There were no vitals filed for this visit. GUENEVERE Alvarado is a pleasant 68 y.o. female in NAD. AAO x 3.   Diabetic foot exam was performed with the following findings:   Vascular Examination: Capillary refill time immediate b/l. Palpable pedal pulses. Pedal hair present b/l. Pedal edema absent. No pain with calf compression b/l. Skin temperature gradient WNL b/l. No cyanosis or clubbing b/l. No ischemia or gangrene noted b/l.   Neurological Examination: Sensation grossly intact b/l with 10 gram monofilament.   Dermatological Examination: Pedal skin with normal turgor, texture and tone b/l.  No open wounds. No interdigital macerations.   Toenails 1-5 b/l thick, discolored, elongated with subungual debris and pain on dorsal palpation.   Hyperkeratotic lesion(s) submet head 5 b/l.  No erythema, no edema, no drainage, no fluctuance.  Musculoskeletal Examination: Normal muscle strength 5/5 to all lower extremity muscle groups bilaterally. Hammertoe(s) b/l 5th toes.. No pain, crepitus or joint limitation noted with ROM b/l LE.  Patient ambulates  independently without assistive aids.  Radiographs: None    Assessment/Plan: 1. Pain due to onychomycosis of toenails of both feet   2. Callus   3. Acquired hammertoes of both feet   4. Type 2 diabetes mellitus with other specified complication, without long-term current use of insulin  (HCC)   5. Encounter for diabetic foot exam (HCC)   Diabetic foot examination performed today. All patient's and/or POA's questions/concerns addressed on today's visit. Toenails 1-5 b/l debrided in length and girth without incident. Callus(es) submet head 5 b/l pared with sharp debridement without incident. Continue daily foot inspections and monitor blood glucose per PCP/Endocrinologist's recommendations.Continue soft, supportive shoe gear daily. Report any pedal injuries to medical professional. Call office if there are any questions/concerns.  Return in about 3 months (around 10/28/2024).  [name], DPM      [place] LOCATION: [address] [place] [address]                   Office [phone]   [facility] LOCATION: [address] [place], [place] [address] Office [phone]

## 2024-08-16 DIAGNOSIS — N1831 Chronic kidney disease, stage 3a: Secondary | ICD-10-CM | POA: Diagnosis not present

## 2024-08-16 DIAGNOSIS — I10 Essential (primary) hypertension: Secondary | ICD-10-CM | POA: Diagnosis not present

## 2024-08-27 ENCOUNTER — Other Ambulatory Visit: Payer: Self-pay | Admitting: Obstetrics and Gynecology

## 2024-08-27 ENCOUNTER — Inpatient Hospital Stay
Admission: RE | Admit: 2024-08-27 | Discharge: 2024-08-27 | Attending: Obstetrics and Gynecology | Admitting: Obstetrics and Gynecology

## 2024-08-27 DIAGNOSIS — N6489 Other specified disorders of breast: Secondary | ICD-10-CM

## 2024-11-10 ENCOUNTER — Ambulatory Visit: Admitting: Podiatry
